# Patient Record
Sex: Female | Born: 1940 | Race: Black or African American | Hispanic: No | Smoking: Former smoker
Health system: Southern US, Community
[De-identification: ages and names within clinical notes are randomized; demographics above are authoritative.]

## PROBLEM LIST (undated history)

## (undated) DIAGNOSIS — C220 Liver cell carcinoma: Secondary | ICD-10-CM

## (undated) DIAGNOSIS — N189 Chronic kidney disease, unspecified: Secondary | ICD-10-CM

## (undated) DIAGNOSIS — I10 Essential (primary) hypertension: Secondary | ICD-10-CM

## (undated) DIAGNOSIS — F419 Anxiety disorder, unspecified: Secondary | ICD-10-CM

## (undated) DIAGNOSIS — Z950 Presence of cardiac pacemaker: Secondary | ICD-10-CM

## (undated) DIAGNOSIS — I509 Heart failure, unspecified: Secondary | ICD-10-CM

## (undated) DIAGNOSIS — F32A Depression, unspecified: Secondary | ICD-10-CM

## (undated) DIAGNOSIS — E119 Type 2 diabetes mellitus without complications: Secondary | ICD-10-CM

## (undated) DIAGNOSIS — F329 Major depressive disorder, single episode, unspecified: Secondary | ICD-10-CM

## (undated) HISTORY — PX: INSERT / REPLACE / REMOVE PACEMAKER: SUR710

## (undated) HISTORY — DX: Liver cell carcinoma: C22.0

## (undated) HISTORY — PX: OTHER SURGICAL HISTORY: SHX169

## (undated) HISTORY — DX: Chronic kidney disease, unspecified: N18.9

---

## 2016-02-24 ENCOUNTER — Ambulatory Visit: Payer: Self-pay | Admitting: Family Medicine

## 2016-03-01 NOTE — Progress Notes (Signed)
Subjective:     Patient ID: Anna Gray, female   DOB: [birth year], 75 y.o.   MRN: [id number]  HPI Anna Gray is a 75yo female presenting today to establish care.  - [id number], Hypertension, CHF (Does not know systolic vs. 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 Reports history of Radiofrequency Ablation and Chemoembolization. On 2 L nasal cannula at night due to metastasis.) - Medications:  Lantus 10 units at night (Reports previously on Glyburide. Does not have syringes or needles, so she has not been using since her discharge from [place] on Saturday 9/23. Would prefer transition to oral agent. Does not check blood sugars. Does not have a glucometer.)  Protonix 40 mg daily  Nifedipine 90 mg daily   Clonidine 0.2 mg twice a day  Isosorbide mononitrate 30 mg daily  Torsemide 20 mg daily  Metoprolol succinate 100 mg daily  Hydralazine 100 mg every 8 hours  Aspirin 81 mg daily  Allopurinol 100 mg daily (Unsure why she is on this medication. Denies history of gout. Would like to discontinue) - Allergies: Vicodin - Hospitalizations:  Reports hospitalizations for liver cancer and lung metastasis  Reports hospitalization in [place] from July to September 2017 for heart failure exacerbation. States she was home for 2 days during that time span however was readmitted. Pacemaker placed during this hospitalization. - Surgeries: Pacemaker placement 2017 - Specialists: Previously followed by cardiology, nephrology, pulmonology, and oncology. Requests referral to these specialists on arrival to [facility]. - Family History: Maternal history of hypertension. Denies history of diabetes, high cholesterol, cancer. - Social History: Previously lived in [place]. In July she moved to [place] with her son. In [place] she was hospitalized from July to September and was discharged to rehabilitation in [place]. She was discharged from  rehabilitation on Saturday, September 23 and moved to [place] with her daughter.  Review of Systems Per HPI.    Objective:   Physical Exam  Constitutional: She appears well-developed and well-nourished. No distress.  HENT:  Head: Normocephalic and atraumatic.  Cardiovascular: Normal rate and regular rhythm.   Murmur heard. Pulmonary/Chest: Effort normal. No respiratory distress. She has no wheezes.  Abdominal: Soft. She exhibits no distension. There is no tenderness.  Musculoskeletal: She exhibits no edema.  Skin: No rash noted.  Psychiatric: She has a normal mood and affect. Her behavior is normal.      Assessment and Plan:     Chronic systolic congestive heart failure [facility]) -Previously followed by cardiology in [place]. Reports hospitalization from July to September 2017 for heart failure exacerbation during which time a pacemaker was placed. Requests referral to get established with cardiology in [facility]. -Referral to cardiology placed -Aspirin, Imdur, metoprolol, nifedipine, and torsemide refilled -Following hospitalization she has been very weak. Requests home health for physical therapy and occupational therapy. Requests hospital bed, which it was recommended she get due to heart failure during her last hospitalization.  Malignant neoplasm of liver (HCC) -Reports history of liver cancer with metastasis to lung. Previously followed by oncology and pulmonology.  -Referral to oncology and pulmonology -O2 saturation of 93% with ambulation noted. Continue to liters nasal cannula at night. -Will check CMP, CBC, and cholesterol  Type 2 diabetes mellitus without complication, with long-term current use of insulin ([place]) -Reports difficulty giving injections. Was prescribed vials however did not have needles or syringes on discharge from rehabilitation. -Lantus pen prescribed. Will continue 10 units at night. -  Requests transition to oral medication. Is difficult to  adjust her insulin without labs. -Will check A1c -Follow-up over the next several weeks for a visit to focus on diabetes management  CKD (chronic kidney disease), stage III -Reported per patient -Will obtain CMP  Essential hypertension -Stable at 127/53 today -Clonidine, hydralazine, metoprolol, Procardia refilled -Continue to monitor  CKD (chronic kidney disease) -Will check CMP -Referral to nephrology placed

## 2016-03-02 ENCOUNTER — Ambulatory Visit (INDEPENDENT_AMBULATORY_CARE_PROVIDER_SITE_OTHER): Payer: Medicare Other | Admitting: Family Medicine

## 2016-03-02 VITALS — BP 127/53 | HR 69 | Temp 98.3°F | Ht 62.5 in | Wt 108.6 lb

## 2016-03-02 DIAGNOSIS — N183 Chronic kidney disease, stage 3 unspecified: Secondary | ICD-10-CM | POA: Insufficient documentation

## 2016-03-02 DIAGNOSIS — C229 Malignant neoplasm of liver, not specified as primary or secondary: Secondary | ICD-10-CM | POA: Diagnosis not present

## 2016-03-02 DIAGNOSIS — I10 Essential (primary) hypertension: Secondary | ICD-10-CM

## 2016-03-02 DIAGNOSIS — N189 Chronic kidney disease, unspecified: Secondary | ICD-10-CM | POA: Insufficient documentation

## 2016-03-02 DIAGNOSIS — Z794 Long term (current) use of insulin: Secondary | ICD-10-CM | POA: Diagnosis not present

## 2016-03-02 DIAGNOSIS — C7800 Secondary malignant neoplasm of unspecified lung: Secondary | ICD-10-CM

## 2016-03-02 DIAGNOSIS — E119 Type 2 diabetes mellitus without complications: Secondary | ICD-10-CM | POA: Diagnosis not present

## 2016-03-02 DIAGNOSIS — I5022 Chronic systolic (congestive) heart failure: Secondary | ICD-10-CM

## 2016-03-02 LAB — CBC WITH DIFFERENTIAL/PLATELET
BASOS PCT: 1 %
Basophils Absolute: 105 cells/uL (ref 0–200)
EOS ABS: 315 {cells}/uL (ref 15–500)
Eosinophils Relative: 3 %
HEMATOCRIT: 25.7 % — AB (ref 35.0–45.0)
HEMOGLOBIN: 8.6 g/dL — AB (ref 11.7–15.5)
LYMPHS ABS: 2520 {cells}/uL (ref 850–3900)
Lymphocytes Relative: 24 %
MCH: 25.7 pg — ABNORMAL LOW (ref 27.0–33.0)
MCHC: 33.5 g/dL (ref 32.0–36.0)
MCV: 76.9 fL — ABNORMAL LOW (ref 80.0–100.0)
MONO ABS: 630 {cells}/uL (ref 200–950)
MPV: 9.5 fL (ref 7.5–12.5)
Monocytes Relative: 6 %
NEUTROS ABS: 6930 {cells}/uL (ref 1500–7800)
Neutrophils Relative %: 66 %
Platelets: 386 10*3/uL (ref 140–400)
RBC: 3.34 MIL/uL — AB (ref 3.80–5.10)
RDW: 17.2 % — AB (ref 11.0–15.0)
WBC: 10.5 10*3/uL (ref 3.8–10.8)

## 2016-03-02 LAB — COMPLETE METABOLIC PANEL WITH GFR
ALK PHOS: 128 U/L (ref 33–130)
ALT: 14 U/L (ref 6–29)
AST: 31 U/L (ref 10–35)
Albumin: 3.4 g/dL — ABNORMAL LOW (ref 3.6–5.1)
BILIRUBIN TOTAL: 0.3 mg/dL (ref 0.2–1.2)
BUN: 46 mg/dL — ABNORMAL HIGH (ref 7–25)
CO2: 24 mmol/L (ref 20–31)
Calcium: 9 mg/dL (ref 8.6–10.4)
Chloride: 105 mmol/L (ref 98–110)
Creat: 2.66 mg/dL — ABNORMAL HIGH (ref 0.60–0.93)
GFR, EST AFRICAN AMERICAN: 20 mL/min — AB (ref >=60)
GFR, EST NON AFRICAN AMERICAN: 17 mL/min — AB (ref >=60)
Glucose, Bld: 198 mg/dL — ABNORMAL HIGH (ref 65–99)
POTASSIUM: 4 mmol/L (ref 3.5–5.3)
Sodium: 140 mmol/L (ref 135–146)
TOTAL PROTEIN: 7 g/dL (ref 6.1–8.1)

## 2016-03-02 LAB — LIPID PANEL
CHOL/HDL RATIO: 2.7 ratio (ref <=5.0)
Cholesterol: 150 mg/dL (ref 125–200)
HDL: 56 mg/dL (ref >=46)
LDL CALC: 65 mg/dL (ref <130)
TRIGLYCERIDES: 145 mg/dL (ref <150)
VLDL: 29 mg/dL (ref <30)

## 2016-03-02 LAB — POCT GLYCOSYLATED HEMOGLOBIN (HGB A1C): Hemoglobin A1C: 7

## 2016-03-02 MED ORDER — METOPROLOL SUCCINATE ER 100 MG PO TB24: 100.0000 mg | ORAL_TABLET | Freq: Every day | ORAL | 4 refills | Status: DC

## 2016-03-02 MED ORDER — NIFEDIPINE ER OSMOTIC RELEASE 90 MG PO TB24: 90.0000 mg | ORAL_TABLET | Freq: Every day | ORAL | 4 refills | Status: DC

## 2016-03-02 MED ORDER — HYDRALAZINE HCL 100 MG PO TABS: 100.0000 mg | ORAL_TABLET | Freq: Three times a day (TID) | ORAL | 4 refills | Status: DC

## 2016-03-02 MED ORDER — ASPIRIN EC 81 MG PO TBEC: 81.0000 mg | DELAYED_RELEASE_TABLET | Freq: Every day | ORAL | 4 refills | Status: AC

## 2016-03-02 MED ORDER — ISOSORBIDE MONONITRATE ER 30 MG PO TB24: 30.0000 mg | ORAL_TABLET | Freq: Every day | ORAL | 4 refills | Status: DC

## 2016-03-02 MED ORDER — TORSEMIDE 20 MG PO TABS: 20.0000 mg | ORAL_TABLET | Freq: Every day | ORAL | 4 refills | Status: DC

## 2016-03-02 MED ORDER — INSULIN GLARGINE 100 UNIT/ML SOLOSTAR PEN: 10.0000 [IU] | PEN_INJECTOR | Freq: Every day | SUBCUTANEOUS | 11 refills | Status: DC

## 2016-03-02 MED ORDER — CLONIDINE HCL 0.2 MG PO TABS: 0.2000 mg | ORAL_TABLET | Freq: Two times a day (BID) | ORAL | 4 refills | Status: DC

## 2016-03-02 MED ORDER — ONETOUCH ULTRA SYSTEM W/DEVICE KIT: 1.0000 | PACK | Freq: Once | 0 refills | Status: AC

## 2016-03-02 MED ORDER — PANTOPRAZOLE SODIUM 40 MG PO TBEC: 40.0000 mg | DELAYED_RELEASE_TABLET | Freq: Every day | ORAL | 4 refills | Status: DC

## 2016-03-02 NOTE — Patient Instructions (Signed)
Thank you so much for coming to visit today! You have quite the extensive history! Please sign the release of information forms so we can get your records from [place] and [place]! Your medications have been refilled. Please ask the pharmacist to review the insulin pen with you. I have placed referrals to Cardiology, Nephrology, Pulmonology, and Oncology. You should hear from them soon. We will check several labs today.  Please return in the next 2-4weeks so we can spend more time focusing on your diabetes.  Dr. Gerlean Ren

## 2016-03-02 NOTE — Assessment & Plan Note (Signed)
-  Will check CMP -Referral to nephrology placed

## 2016-03-02 NOTE — Assessment & Plan Note (Addendum)
-  Reports history of liver cancer with metastasis to lung. Previously followed by oncology and pulmonology.  -Referral to oncology and pulmonology -O2 saturation of 93% with ambulation noted. Continue to liters nasal cannula at night. -Will check CMP, CBC, and cholesterol

## 2016-03-02 NOTE — Assessment & Plan Note (Signed)
-  Reported per patient -Will obtain CMP

## 2016-03-02 NOTE — Assessment & Plan Note (Signed)
-  Stable at 127/53 today -Clonidine, hydralazine, metoprolol, Procardia refilled -Continue to monitor

## 2016-03-02 NOTE — Assessment & Plan Note (Signed)
-  Reports difficulty giving injections. Was prescribed vials however did not have needles or syringes on discharge from rehabilitation. -Lantus pen prescribed. Will continue 10 units at night. -Requests transition to oral medication. Is difficult to adjust her insulin without labs. -Will check A1c -Follow-up over the next several weeks for a visit to focus on diabetes management

## 2016-03-02 NOTE — Assessment & Plan Note (Addendum)
-  Previously followed by cardiology in [place]. Reports hospitalization from July to September 2017 for heart failure exacerbation during which time a pacemaker was placed. Requests referral to get established with cardiology in [facility]. -Referral to cardiology placed -Aspirin, Imdur, metoprolol, nifedipine, and torsemide refilled -Following hospitalization she has been very weak. Requests home health for physical therapy and occupational therapy. Requests hospital bed, which it was recommended she get due to heart failure during her last hospitalization.

## 2016-03-08 ENCOUNTER — Telehealth: Payer: Self-pay | Admitting: Family Medicine

## 2016-03-08 NOTE — Telephone Encounter (Signed)
Pt's daughter would like for you to give her a call, regarding referral to nephrology. Please advise. Thanks! ep

## 2016-03-09 ENCOUNTER — Emergency Department (HOSPITAL_COMMUNITY): Payer: Medicare Other

## 2016-03-09 ENCOUNTER — Other Ambulatory Visit: Payer: Self-pay

## 2016-03-09 ENCOUNTER — Institutional Professional Consult (permissible substitution): Payer: Medicare Other | Admitting: Internal Medicine

## 2016-03-09 ENCOUNTER — Encounter (HOSPITAL_COMMUNITY): Payer: Self-pay

## 2016-03-09 ENCOUNTER — Inpatient Hospital Stay (HOSPITAL_COMMUNITY)
Admission: EM | Admit: 2016-03-09 | Discharge: 2016-03-14 | DRG: 291 | Disposition: A | Discharge status: Home / Self Care | Payer: Medicare Other | Attending: Internal Medicine | Admitting: Internal Medicine

## 2016-03-09 DIAGNOSIS — Z8505 Personal history of malignant neoplasm of liver: Secondary | ICD-10-CM | POA: Diagnosis not present

## 2016-03-09 DIAGNOSIS — N179 Acute kidney failure, unspecified: Secondary | ICD-10-CM | POA: Diagnosis present

## 2016-03-09 DIAGNOSIS — Z950 Presence of cardiac pacemaker: Secondary | ICD-10-CM

## 2016-03-09 DIAGNOSIS — E872 Acidosis: Secondary | ICD-10-CM | POA: Diagnosis not present

## 2016-03-09 DIAGNOSIS — Z885 Allergy status to narcotic agent status: Secondary | ICD-10-CM | POA: Diagnosis not present

## 2016-03-09 DIAGNOSIS — N184 Chronic kidney disease, stage 4 (severe): Secondary | ICD-10-CM | POA: Diagnosis not present

## 2016-03-09 DIAGNOSIS — E1122 Type 2 diabetes mellitus with diabetic chronic kidney disease: Secondary | ICD-10-CM | POA: Diagnosis present

## 2016-03-09 DIAGNOSIS — Z794 Long term (current) use of insulin: Secondary | ICD-10-CM | POA: Diagnosis not present

## 2016-03-09 DIAGNOSIS — Y95 Nosocomial condition: Secondary | ICD-10-CM | POA: Diagnosis present

## 2016-03-09 DIAGNOSIS — Z66 Do not resuscitate: Secondary | ICD-10-CM | POA: Diagnosis present

## 2016-03-09 DIAGNOSIS — R0603 Acute respiratory distress: Secondary | ICD-10-CM

## 2016-03-09 DIAGNOSIS — R0600 Dyspnea, unspecified: Secondary | ICD-10-CM

## 2016-03-09 DIAGNOSIS — N189 Chronic kidney disease, unspecified: Secondary | ICD-10-CM | POA: Diagnosis present

## 2016-03-09 DIAGNOSIS — C787 Secondary malignant neoplasm of liver and intrahepatic bile duct: Secondary | ICD-10-CM | POA: Diagnosis not present

## 2016-03-09 DIAGNOSIS — Z87891 Personal history of nicotine dependence: Secondary | ICD-10-CM

## 2016-03-09 DIAGNOSIS — Z09 Encounter for follow-up examination after completed treatment for conditions other than malignant neoplasm: Secondary | ICD-10-CM

## 2016-03-09 DIAGNOSIS — I5023 Acute on chronic systolic (congestive) heart failure: Secondary | ICD-10-CM | POA: Diagnosis present

## 2016-03-09 DIAGNOSIS — I509 Heart failure, unspecified: Secondary | ICD-10-CM | POA: Diagnosis not present

## 2016-03-09 DIAGNOSIS — Z85118 Personal history of other malignant neoplasm of bronchus and lung: Secondary | ICD-10-CM

## 2016-03-09 DIAGNOSIS — J811 Chronic pulmonary edema: Secondary | ICD-10-CM

## 2016-03-09 DIAGNOSIS — Z9221 Personal history of antineoplastic chemotherapy: Secondary | ICD-10-CM

## 2016-03-09 DIAGNOSIS — I5043 Acute on chronic combined systolic (congestive) and diastolic (congestive) heart failure: Secondary | ICD-10-CM | POA: Diagnosis not present

## 2016-03-09 DIAGNOSIS — I248 Other forms of acute ischemic heart disease: Secondary | ICD-10-CM | POA: Diagnosis present

## 2016-03-09 DIAGNOSIS — I130 Hypertensive heart and chronic kidney disease with heart failure and stage 1 through stage 4 chronic kidney disease, or unspecified chronic kidney disease: Secondary | ICD-10-CM | POA: Diagnosis present

## 2016-03-09 DIAGNOSIS — Z9111 Patient's noncompliance with dietary regimen: Secondary | ICD-10-CM | POA: Diagnosis not present

## 2016-03-09 DIAGNOSIS — N183 Chronic kidney disease, stage 3 (moderate): Secondary | ICD-10-CM | POA: Diagnosis present

## 2016-03-09 DIAGNOSIS — I10 Essential (primary) hypertension: Secondary | ICD-10-CM | POA: Diagnosis present

## 2016-03-09 DIAGNOSIS — E876 Hypokalemia: Secondary | ICD-10-CM | POA: Diagnosis not present

## 2016-03-09 DIAGNOSIS — J9600 Acute respiratory failure, unspecified whether with hypoxia or hypercapnia: Secondary | ICD-10-CM | POA: Diagnosis not present

## 2016-03-09 DIAGNOSIS — I5021 Acute systolic (congestive) heart failure: Secondary | ICD-10-CM | POA: Diagnosis not present

## 2016-03-09 DIAGNOSIS — D509 Iron deficiency anemia, unspecified: Secondary | ICD-10-CM | POA: Diagnosis present

## 2016-03-09 DIAGNOSIS — E119 Type 2 diabetes mellitus without complications: Secondary | ICD-10-CM | POA: Diagnosis not present

## 2016-03-09 DIAGNOSIS — J9601 Acute respiratory failure with hypoxia: Secondary | ICD-10-CM | POA: Diagnosis present

## 2016-03-09 DIAGNOSIS — R748 Abnormal levels of other serum enzymes: Secondary | ICD-10-CM | POA: Diagnosis not present

## 2016-03-09 DIAGNOSIS — C229 Malignant neoplasm of liver, not specified as primary or secondary: Secondary | ICD-10-CM | POA: Diagnosis present

## 2016-03-09 DIAGNOSIS — Z7982 Long term (current) use of aspirin: Secondary | ICD-10-CM | POA: Diagnosis not present

## 2016-03-09 DIAGNOSIS — C227 Other specified carcinomas of liver: Secondary | ICD-10-CM | POA: Diagnosis not present

## 2016-03-09 DIAGNOSIS — J189 Pneumonia, unspecified organism: Secondary | ICD-10-CM | POA: Diagnosis present

## 2016-03-09 HISTORY — DX: Presence of cardiac pacemaker: Z95.0

## 2016-03-09 HISTORY — DX: Heart failure, unspecified: I50.9

## 2016-03-09 HISTORY — DX: Type 2 diabetes mellitus without complications: E11.9

## 2016-03-09 HISTORY — DX: Major depressive disorder, single episode, unspecified: F32.9

## 2016-03-09 HISTORY — DX: Anxiety disorder, unspecified: F41.9

## 2016-03-09 HISTORY — DX: Essential (primary) hypertension: I10

## 2016-03-09 HISTORY — DX: Depression, unspecified: F32.A

## 2016-03-09 LAB — COMPREHENSIVE METABOLIC PANEL
ALBUMIN: 3.3 g/dL — AB (ref 3.5–5.0)
ALT: 23 U/L (ref 14–54)
AST: 56 U/L — AB (ref 15–41)
Alkaline Phosphatase: 203 U/L — ABNORMAL HIGH (ref 38–126)
Anion gap: 13 (ref 5–15)
BUN: 44 mg/dL — AB (ref 6–20)
CHLORIDE: 105 mmol/L (ref 101–111)
CO2: 22 mmol/L (ref 22–32)
CREATININE: 2.37 mg/dL — AB (ref 0.44–1.00)
Calcium: 9.2 mg/dL (ref 8.9–10.3)
GFR calc Af Amer: 22 mL/min — ABNORMAL LOW (ref >60)
GFR, EST NON AFRICAN AMERICAN: 19 mL/min — AB (ref >60)
Glucose, Bld: 235 mg/dL — ABNORMAL HIGH (ref 65–99)
POTASSIUM: 3.5 mmol/L (ref 3.5–5.1)
SODIUM: 140 mmol/L (ref 135–145)
Total Bilirubin: 0.5 mg/dL (ref 0.3–1.2)
Total Protein: 8.8 g/dL — ABNORMAL HIGH (ref 6.5–8.1)

## 2016-03-09 LAB — CBC WITH DIFFERENTIAL/PLATELET
BASOS PCT: 0 %
Basophils Absolute: 0 10*3/uL (ref 0.0–0.1)
EOS PCT: 2 %
Eosinophils Absolute: 0.4 10*3/uL (ref 0.0–0.7)
HEMATOCRIT: 31.2 % — AB (ref 36.0–46.0)
Hemoglobin: 10.5 g/dL — ABNORMAL LOW (ref 12.0–15.0)
LYMPHS ABS: 8.1 10*3/uL — AB (ref 0.7–4.0)
Lymphocytes Relative: 44 %
MCH: 25.9 pg — AB (ref 26.0–34.0)
MCHC: 33.7 g/dL (ref 30.0–36.0)
MCV: 77 fL — AB (ref 78.0–100.0)
MONOS PCT: 4 %
Monocytes Absolute: 0.7 10*3/uL (ref 0.1–1.0)
Neutro Abs: 9.2 10*3/uL — ABNORMAL HIGH (ref 1.7–7.7)
Neutrophils Relative %: 50 %
Platelets: 474 10*3/uL — ABNORMAL HIGH (ref 150–400)
RBC: 4.05 MIL/uL (ref 3.87–5.11)
RDW: 16.6 % — AB (ref 11.5–15.5)
WBC: 18.4 10*3/uL — AB (ref 4.0–10.5)

## 2016-03-09 LAB — I-STAT TROPONIN, ED: TROPONIN I, POC: 0.01 ng/mL (ref 0.00–0.08)

## 2016-03-09 LAB — GLUCOSE, CAPILLARY
GLUCOSE-CAPILLARY: 332 mg/dL — AB (ref 65–99)
GLUCOSE-CAPILLARY: 249 mg/dL — AB (ref 65–99)
Glucose-Capillary: 171 mg/dL — ABNORMAL HIGH (ref 65–99)

## 2016-03-09 LAB — I-STAT CHEM 8, ED
BUN: 55 mg/dL — AB (ref 6–20)
CREATININE: 2.3 mg/dL — AB (ref 0.44–1.00)
Calcium, Ion: 1.16 mmol/L (ref 1.15–1.40)
Chloride: 106 mmol/L (ref 101–111)
GLUCOSE: 228 mg/dL — AB (ref 65–99)
HEMATOCRIT: 33 % — AB (ref 36.0–46.0)
Hemoglobin: 11.2 g/dL — ABNORMAL LOW (ref 12.0–15.0)
POTASSIUM: 3.8 mmol/L (ref 3.5–5.1)
Sodium: 142 mmol/L (ref 135–145)
TCO2: 25 mmol/L (ref 0–100)

## 2016-03-09 LAB — MRSA PCR SCREENING: MRSA BY PCR: NEGATIVE

## 2016-03-09 LAB — URINE MICROSCOPIC-ADD ON: RBC / HPF: NONE SEEN RBC/hpf (ref 0–5)

## 2016-03-09 LAB — TROPONIN I
TROPONIN I: 0.4 ng/mL — AB (ref <0.03)
TROPONIN I: 0.47 ng/mL — AB (ref <0.03)
Troponin I: 0.24 ng/mL (ref <0.03)

## 2016-03-09 LAB — URINALYSIS, ROUTINE W REFLEX MICROSCOPIC
Bilirubin Urine: NEGATIVE
Glucose, UA: 250 mg/dL — AB
Hgb urine dipstick: NEGATIVE
KETONES UR: NEGATIVE mg/dL
LEUKOCYTES UA: NEGATIVE
NITRITE: NEGATIVE
Specific Gravity, Urine: 1.016 (ref 1.005–1.030)
pH: 5 (ref 5.0–8.0)

## 2016-03-09 LAB — I-STAT CG4 LACTIC ACID, ED: Lactic Acid, Venous: 3.78 mmol/L (ref 0.5–1.9)

## 2016-03-09 LAB — BRAIN NATRIURETIC PEPTIDE: B Natriuretic Peptide: 1185.9 pg/mL — ABNORMAL HIGH (ref 0.0–100.0)

## 2016-03-09 MED ORDER — HYDRALAZINE HCL 20 MG/ML IJ SOLN
10.0000 mg | INTRAMUSCULAR | Status: DC | PRN
  Administered 2016-03-09 – 2016-03-10 (×2): 10 mg via INTRAVENOUS
  Filled 2016-03-09 (×2): qty 1

## 2016-03-09 MED ORDER — DEXTROSE 5 % IV SOLN
500.0000 mg | INTRAVENOUS | Status: DC
  Administered 2016-03-10 – 2016-03-12 (×3): 500 mg via INTRAVENOUS
  Filled 2016-03-09 (×4): qty 0.5

## 2016-03-09 MED ORDER — NITROGLYCERIN IN D5W 200-5 MCG/ML-% IV SOLN
5.0000 ug/min | INTRAVENOUS | Status: DC
  Administered 2016-03-09: 65 ug/min via INTRAVENOUS
  Administered 2016-03-09: 10 ug/min via INTRAVENOUS
  Administered 2016-03-10: 25 ug/min via INTRAVENOUS
  Administered 2016-03-10: 90 ug/min via INTRAVENOUS
  Administered 2016-03-10: 85 ug/min via INTRAVENOUS
  Filled 2016-03-09 (×3): qty 250

## 2016-03-09 MED ORDER — ONDANSETRON HCL 4 MG PO TABS
4.0000 mg | ORAL_TABLET | Freq: Four times a day (QID) | ORAL | Status: DC | PRN
  Administered 2016-03-10 – 2016-03-14 (×4): 4 mg via ORAL
  Filled 2016-03-09 (×4): qty 1

## 2016-03-09 MED ORDER — SODIUM CHLORIDE 0.9 % IV SOLN
INTRAVENOUS | Status: DC
  Administered 2016-03-09: 07:00:00 via INTRAVENOUS

## 2016-03-09 MED ORDER — ASPIRIN EC 81 MG PO TBEC
81.0000 mg | DELAYED_RELEASE_TABLET | Freq: Every day | ORAL | Status: DC
  Administered 2016-03-11 – 2016-03-14 (×4): 81 mg via ORAL
  Filled 2016-03-09 (×5): qty 1

## 2016-03-09 MED ORDER — LORAZEPAM 2 MG/ML IJ SOLN
0.5000 mg | Freq: Once | INTRAMUSCULAR | Status: AC
  Administered 2016-03-09: 0.5 mg via INTRAVENOUS
  Filled 2016-03-09: qty 1

## 2016-03-09 MED ORDER — VANCOMYCIN HCL IN DEXTROSE 1-5 GM/200ML-% IV SOLN
1000.0000 mg | Freq: Once | INTRAVENOUS | Status: AC
  Administered 2016-03-09: 1000 mg via INTRAVENOUS
  Filled 2016-03-09: qty 200

## 2016-03-09 MED ORDER — INSULIN GLARGINE 100 UNIT/ML ~~LOC~~ SOLN
10.0000 [IU] | Freq: Every day | SUBCUTANEOUS | Status: DC
  Administered 2016-03-09 – 2016-03-13 (×5): 10 [IU] via SUBCUTANEOUS
  Filled 2016-03-09 (×6): qty 0.1

## 2016-03-09 MED ORDER — ENOXAPARIN SODIUM 30 MG/0.3ML ~~LOC~~ SOLN
30.0000 mg | SUBCUTANEOUS | Status: DC
  Administered 2016-03-09 – 2016-03-14 (×6): 30 mg via SUBCUTANEOUS
  Filled 2016-03-09 (×6): qty 0.3

## 2016-03-09 MED ORDER — FUROSEMIDE 10 MG/ML IJ SOLN
40.0000 mg | Freq: Once | INTRAMUSCULAR | Status: AC
  Administered 2016-03-09: 40 mg via INTRAVENOUS
  Filled 2016-03-09: qty 4

## 2016-03-09 MED ORDER — ORAL CARE MOUTH RINSE
15.0000 mL | Freq: Two times a day (BID) | OROMUCOSAL | Status: DC
  Administered 2016-03-10 – 2016-03-13 (×6): 15 mL via OROMUCOSAL

## 2016-03-09 MED ORDER — METOPROLOL SUCCINATE ER 25 MG PO TB24
100.0000 mg | ORAL_TABLET | Freq: Every day | ORAL | Status: DC
  Administered 2016-03-10: 100 mg via ORAL
  Filled 2016-03-09: qty 4

## 2016-03-09 MED ORDER — LORAZEPAM 2 MG/ML IJ SOLN
1.0000 mg | Freq: Four times a day (QID) | INTRAMUSCULAR | Status: DC | PRN
  Administered 2016-03-09 – 2016-03-10 (×3): 1 mg via INTRAVENOUS
  Filled 2016-03-09 (×3): qty 1

## 2016-03-09 MED ORDER — CHLORHEXIDINE GLUCONATE 0.12 % MT SOLN
15.0000 mL | Freq: Two times a day (BID) | OROMUCOSAL | Status: DC
  Administered 2016-03-10 – 2016-03-14 (×5): 15 mL via OROMUCOSAL
  Filled 2016-03-09 (×6): qty 15

## 2016-03-09 MED ORDER — FUROSEMIDE 10 MG/ML IJ SOLN
40.0000 mg | Freq: Two times a day (BID) | INTRAMUSCULAR | Status: DC
  Administered 2016-03-09 – 2016-03-10 (×2): 40 mg via INTRAVENOUS
  Filled 2016-03-09 (×2): qty 4

## 2016-03-09 MED ORDER — INSULIN ASPART 100 UNIT/ML ~~LOC~~ SOLN
0.0000 [IU] | Freq: Three times a day (TID) | SUBCUTANEOUS | Status: DC
  Administered 2016-03-09: 3 [IU] via SUBCUTANEOUS
  Administered 2016-03-09: 7 [IU] via SUBCUTANEOUS
  Administered 2016-03-10 (×2): 2 [IU] via SUBCUTANEOUS
  Administered 2016-03-12: 1 [IU] via SUBCUTANEOUS
  Administered 2016-03-13: 2 [IU] via SUBCUTANEOUS
  Administered 2016-03-13 (×2): 1 [IU] via SUBCUTANEOUS
  Administered 2016-03-14: 2 [IU] via SUBCUTANEOUS

## 2016-03-09 MED ORDER — VANCOMYCIN HCL 500 MG IV SOLR
500.0000 mg | INTRAVENOUS | Status: DC
  Filled 2016-03-09: qty 500

## 2016-03-09 MED ORDER — LORAZEPAM 2 MG/ML IJ SOLN
1.0000 mg | Freq: Once | INTRAMUSCULAR | Status: AC
  Administered 2016-03-09: 1 mg via INTRAVENOUS
  Filled 2016-03-09: qty 1

## 2016-03-09 MED ORDER — NIFEDIPINE ER OSMOTIC RELEASE 90 MG PO TB24
90.0000 mg | ORAL_TABLET | Freq: Every day | ORAL | Status: DC
  Administered 2016-03-10 – 2016-03-12 (×3): 90 mg via ORAL
  Filled 2016-03-09 (×4): qty 1

## 2016-03-09 MED ORDER — PANTOPRAZOLE SODIUM 40 MG PO TBEC
40.0000 mg | DELAYED_RELEASE_TABLET | Freq: Every day | ORAL | Status: DC
  Administered 2016-03-10 – 2016-03-14 (×5): 40 mg via ORAL
  Filled 2016-03-09 (×5): qty 1

## 2016-03-09 MED ORDER — DEXTROSE 5 % IV SOLN
2.0000 g | Freq: Once | INTRAVENOUS | Status: AC
  Administered 2016-03-09: 2 g via INTRAVENOUS
  Filled 2016-03-09: qty 2

## 2016-03-09 MED ORDER — ONDANSETRON HCL 4 MG/2ML IJ SOLN
4.0000 mg | Freq: Four times a day (QID) | INTRAMUSCULAR | Status: DC | PRN
  Administered 2016-03-10 – 2016-03-13 (×3): 4 mg via INTRAVENOUS
  Filled 2016-03-09 (×3): qty 2

## 2016-03-09 MED ORDER — CLONIDINE HCL 0.1 MG PO TABS
0.2000 mg | ORAL_TABLET | Freq: Two times a day (BID) | ORAL | Status: DC
  Administered 2016-03-10: 0.1 mg via ORAL
  Administered 2016-03-10 – 2016-03-12 (×4): 0.2 mg via ORAL
  Filled 2016-03-09 (×5): qty 2

## 2016-03-09 MED ORDER — HYDRALAZINE HCL 50 MG PO TABS
100.0000 mg | ORAL_TABLET | Freq: Three times a day (TID) | ORAL | Status: DC
  Administered 2016-03-10 – 2016-03-12 (×7): 100 mg via ORAL
  Filled 2016-03-09 (×7): qty 2

## 2016-03-09 NOTE — Progress Notes (Signed)
Pt remains on BIPAP/NIV due to her WOB and SOB.

## 2016-03-09 NOTE — ED Notes (Signed)
Pt. Had begun to desat. On FIo2 of 80%. FIO2 has just been increased by R.T. Pt. Remains air-hungry and is in her position of comfort, which is to lie on her right side.

## 2016-03-09 NOTE — ED Notes (Signed)
MD at bedside. 

## 2016-03-09 NOTE — ED Notes (Signed)
Pt is on the bedpan

## 2016-03-09 NOTE — ED Notes (Signed)
The hospitalist is with pt. And her daughter. Pt. Remains very uncomfortable, having just received a second dose of IV ativan. Pt. Remains responsive and is consistently adamant about her desire not to be intubated as she continues to decline in terms of dyspnea.

## 2016-03-09 NOTE — Telephone Encounter (Signed)
LMOVM for daughter to call me back.

## 2016-03-09 NOTE — Progress Notes (Signed)
Pt refuses ABG. RN aware.

## 2016-03-09 NOTE — ED Notes (Signed)
Hospitalist at bedside 

## 2016-03-09 NOTE — ED Triage Notes (Signed)
Patient arrives Emergency traffic with complaints of respiratory distress since 0500. Patient has a hx of lung cancer, CHF, pace maker, liver cancer, chronic kidney disease.

## 2016-03-09 NOTE — Progress Notes (Signed)
Pharmacy Antibiotic Note  Anna Gray is a 75 y.o. female admitted on 03/09/2016 with pneumonia. CXR shows COPD. Interstitial edema or infiltrate compatible with CHF and/or pneumonia. Lactate elevated at 3.78.  Pharmacy has been consulted for vancomycin and cefepime dosing.  Plan: Vancomycin 1g IV ordered in ED, then 500mg   IV every 48 hours.  Goal trough 15-20 mcg/mL.  Cefepime 2g IV ordered in ED, then 500mg  IV q24h Follow up renal function & cultures, de-escalate as appropriate    No data recorded.   Recent Labs Lab 03/02/16 1041 03/09/16 0701 03/09/16 0708 03/09/16 0709  WBC 10.5 18.4*  --   --   CREATININE 2.66* 2.37* 2.30*  --   LATICACIDVEN  --   --   --  3.78*    Estimated Creatinine Clearance: 16.4 mL/min (by C-G formula based on SCr of 2.3 mg/dL (H)).    Allergies  Allergen Reactions  . Vicodin [Hydrocodone-Acetaminophen] Nausea And Vomiting    Antimicrobials this admission:  10/4 Vancomycin >> 10/4 Cefepime >>  Dose adjustments this admission:  ---  Microbiology results:  10/4 BCx: sent 10/4 UCx: sent   Thank you for allowing pharmacy to be a part of this patient's care.  Peggyann Juba, PharmD, BCPS Pager: [phone] 03/09/2016 7:59 AM

## 2016-03-09 NOTE — Progress Notes (Signed)
CRITICAL VALUE ALERT  Critical value received: troponin  Date of notification: 10/4  Time of notification: 1300  Critical value read back:yes  Nurse who received alert:  Dois Davenport, RN  MD notified (1st page):  Darrick Meigs, MD  Time of first page:  1305  Time MD responded:  no response yet, will continue to closely monitor pt.

## 2016-03-09 NOTE — ED Provider Notes (Signed)
[place] DEPT Provider Note   CSN: [id number] Arrival date & time: 03/09/16  0650     History   Chief Complaint Chief Complaint  Patient presents with  . Respiratory Distress    HPI Anna Gray is a 75 y.o. female.  75 year old female with past medical history including metastatic lung cancer, CHF, type 2 diabetes, hypertension who presents with respiratory distress. EMS picked the patient up from her home this morning for respiratory distress which she states began at 5 AM. She denies any fevers, vomiting, chest pain, or other source of pain. She does not want to be intubated.   The history is provided by the patient and the EMS personnel.    No past medical history on file.  Patient Active Problem List   Diagnosis Date Noted  . Chronic systolic congestive heart failure ([place]) 03/02/2016  . CKD (chronic kidney disease) 03/02/2016  . Malignant neoplasm metastatic to lung ([place]) 03/02/2016  . Malignant neoplasm of liver ([place]) 03/02/2016  . Type 2 diabetes mellitus without complication, with long-term current use of insulin ([place]) 03/02/2016  . Essential hypertension 03/02/2016    No past surgical history on file.  OB History    No data available       Home Medications    Prior to Admission medications   Medication Sig Start Date End Date Taking? Authorizing Provider  aspirin EC 81 MG tablet Take 1 tablet (81 mg total) by mouth daily. 03/02/16   [place] N Rumley, DO  cloNIDine (CATAPRES) 0.2 MG tablet Take 1 tablet (0.2 mg total) by mouth 2 (two) times daily. 03/02/16   [place] N Rumley, DO  hydrALAZINE (APRESOLINE) 100 MG tablet Take 1 tablet (100 mg total) by mouth 3 (three) times daily. 03/02/16   [place] N Rumley, DO  Insulin Glargine (LANTUS) 100 UNIT/ML Solostar Pen Inject 10 Units into the skin daily at 10 pm. 03/02/16   [place] N Rumley, DO  isosorbide mononitrate (IMDUR) 30 MG 24 hr tablet Take 1 tablet (30 mg total) by mouth daily. 03/02/16   [place] N  Rumley, DO  metoprolol succinate (TOPROL-XL) 100 MG 24 hr tablet Take 1 tablet (100 mg total) by mouth daily. Take with or immediately following a meal. 03/02/16   [place] N Rumley, DO  NIFEdipine (PROCARDIA XL/ADALAT-CC) 90 MG 24 hr tablet Take 1 tablet (90 mg total) by mouth daily. 03/02/16   [place] N Rumley, DO  pantoprazole (PROTONIX) 40 MG tablet Take 1 tablet (40 mg total) by mouth daily. 03/02/16   [place] N Rumley, DO  torsemide (DEMADEX) 20 MG tablet Take 1 tablet (20 mg total) by mouth daily. 03/02/16   Lorna Few, DO    Family History No family history on file.  Social History Social History  Substance Use Topics  . Smoking status: Not on file  . Smokeless tobacco: Not on file  . Alcohol use Not on file     Allergies   Vicodin [hydrocodone-acetaminophen]   Review of Systems Review of Systems  Unable to perform ROS: Severe respiratory distress     Physical Exam Updated Vital Signs BP (!) 199/109   Pulse 106   Resp (!) 38   SpO2 100%   Physical Exam  Constitutional: She is oriented to person, place, and time. She appears distressed.  Thin, frail, elderly woman in mild respiratory distress, on bipap  HENT:  Head: Normocephalic and atraumatic.  Moist mucous membranes  Eyes: Conjunctivae are normal. Pupils are equal, round, and reactive to  light.  Neck: Neck supple.  Cardiovascular: Regular rhythm and normal heart sounds.  Tachycardia present.   No murmur heard. Pulmonary/Chest: Accessory muscle usage present. Tachypnea noted. She is in respiratory distress.  Crackles b/l with diminished BS; on bipap  Abdominal: Soft. Bowel sounds are normal. She exhibits no distension. There is no tenderness.  Musculoskeletal: She exhibits no edema.  Neurological: She is alert and oriented to person, place, and time.  Fluent speech  Skin: Skin is warm and dry.  Nursing note and vitals reviewed.    ED Treatments / Results  Labs (all labs ordered are listed, but only  abnormal results are displayed) Labs Reviewed  COMPREHENSIVE METABOLIC PANEL - Abnormal; Notable for the following:       Result Value   Glucose, Bld 235 (*)    BUN 44 (*)    Creatinine, Ser 2.37 (*)    Total Protein 8.8 (*)    Albumin 3.3 (*)    AST 56 (*)    Alkaline Phosphatase 203 (*)    GFR calc non Af Amer 19 (*)    GFR calc Af Amer 22 (*)    All other components within normal limits  CBC WITH DIFFERENTIAL/PLATELET - Abnormal; Notable for the following:    WBC 18.4 (*)    Hemoglobin 10.5 (*)    HCT 31.2 (*)    MCV 77.0 (*)    MCH 25.9 (*)    RDW 16.6 (*)    Platelets 474 (*)    Neutro Abs 9.2 (*)    Lymphs Abs 8.1 (*)    All other components within normal limits  BRAIN NATRIURETIC PEPTIDE - Abnormal; Notable for the following:    B Natriuretic Peptide 1,185.9 (*)    All other components within normal limits  I-STAT CHEM 8, ED - Abnormal; Notable for the following:    BUN 55 (*)    Creatinine, Ser 2.30 (*)    Glucose, Bld 228 (*)    Hemoglobin 11.2 (*)    HCT 33.0 (*)    All other components within normal limits  I-STAT CG4 LACTIC ACID, ED - Abnormal; Notable for the following:    Lactic Acid, Venous 3.78 (*)    All other components within normal limits  CULTURE, BLOOD (ROUTINE X 2)  CULTURE, BLOOD (ROUTINE X 2)  URINE CULTURE  BLOOD GAS, ARTERIAL  URINALYSIS, ROUTINE W REFLEX MICROSCOPIC (NOT AT [facility])  I-STAT TROPOININ, ED    EKG  EKG Interpretation  Date/Time:  Wednesday March 09 2016 06:52:49 EDT Ventricular Rate:  123 PR Interval:    QRS Duration: 84 QT Interval:  322 QTC Calculation: 461 R Axis:   59 Text Interpretation:  Sinus or ectopic atrial tachycardia Consider left atrial enlargement Repol abnrm suggests ischemia, diffuse leads No old tracing to compare Confirmed by [facility]  MD, ELLIOTT [id number]) on 03/09/2016 7:14:22 AM       Radiology Dg Chest Port 1 View  Result Date: 03/09/2016 CLINICAL DATA:  Shortness of breath, difficulty breathing  with increased symptoms since 5 a.m. today. History of lung malignancy, CHF, diabetes, chronic renal insufficiency. EXAM: PORTABLE CHEST 1 VIEW COMPARISON:  None in PACs FINDINGS: The lungs are hyperinflated. The interstitial markings are increased diffusely with areas of near confluence in the left mid lung. The heart is top-normal in size. The pulmonary vascularity is mildly prominent centrally. There is calcification in the wall of the aortic arch. The permanent pacemaker is in reasonable position radiographically. IMPRESSION: COPD. Interstitial edema  or infiltrate compatible with CHF and/or pneumonia. Aortic atherosclerosis. Electronically Signed   By: David  Martinique M.D.   On: 03/09/2016 07:14    Procedures .Critical Care Performed by: Sharlett Iles Authorized by: Sharlett Iles   Critical care provider statement:    Critical care time (minutes):  60   Critical care time was exclusive of:  Separately billable procedures and treating other patients   Critical care was necessary to treat or prevent imminent or life-threatening deterioration of the following conditions:  Respiratory failure   Critical care was time spent personally by me on the following activities:  Development of treatment plan with patient or surrogate, evaluation of patient's response to treatment, examination of patient, obtaining history from patient or surrogate, review of old charts, re-evaluation of patient's condition, pulse oximetry, ordering and review of radiographic studies, ordering and review of laboratory studies and ordering and performing treatments and interventions   (including critical care time)  Medications Ordered in ED Medications  0.9 %  sodium chloride infusion ( Intravenous New Bag/Given 03/09/16 0716)  nitroGLYCERIN 50 mg in dextrose 5 % 250 mL (0.2 mg/mL) infusion (20 mcg/min Intravenous Rate/Dose Change 03/09/16 0726)  ceFEPIme (MAXIPIME) 2 g in dextrose 5 % 50 mL IVPB (2 g  Intravenous New Bag/Given 03/09/16 0759)  vancomycin (VANCOCIN) IVPB 1000 mg/200 mL premix (not administered)  vancomycin (VANCOCIN) 500 mg in sodium chloride 0.9 % 100 mL IVPB (not administered)  ceFEPIme (MAXIPIME) 500 mg in dextrose 5 % 50 mL IVPB (not administered)  LORazepam (ATIVAN) injection 0.5 mg (0.5 mg Intravenous Given 03/09/16 0812)     Initial Impression / Assessment and Plan / ED Course  I have reviewed the triage vital signs and the nursing notes.  Pertinent labs & imaging results that were available during my care of the patient were reviewed by me and considered in my medical decision making (see chart for details).  Clinical Course   Pt w/ known Lung cancer as well as CHF presents with respiratory distress beginning this morning. She was placed on BiPAP on arrival and my colleague began a nitroglycerin drip due to suspicion for CHF given that her initial BP was 199/109. She had crackles bilaterally, respiratory distress but mentating appropriately. Continued BiPAP and obtained above lab work. Initial lactate elevated at 3.78, creatinine 2.37 similar to previous. WBC elevated at 18.4. BNP also elevated at 1185. Chest x-ray shows pulmonary edema versus pneumonia. Treated for sepsis with blood and urine cultures, and broad-spectrum antibiotics to cover HCAP, as well as lasix to treat volume overload. Held on fluid resuscitation given that patient is in respiratory distress at least in part due to pulmonary edema from CHF exacerbation. Because she is severely hypertensive, I suspect CHF is the predominant factor driving her respiratory symptoms. She has required several doses of Ativan to tolerate the mask. She has also expressed that she does not want intubation. Her blood pressure has improved on nitroglycerin. I discussed admission with hospitalist, Dr. Darrick Meigs, and patient admitted to stepdown for further treatment.  Final Clinical Impressions(s) / ED Diagnoses   Final diagnoses:    None    New Prescriptions New Prescriptions   No medications on file     Sharlett Iles, MD 03/09/16 [phone]

## 2016-03-09 NOTE — H&P (Signed)
TRH H&P    Patient Demographics:    Anna Gray, is a 75 y.o. female  MRN: [id number]  DOB - [birth year]  Admit Date - 03/09/2016  Referring MD/NP/PA: Dr Rex Kras  Outpatient Primary MD for the patient is Junie Panning, DO  Patient coming from: Home  Chief Complaint  Patient presents with  . Respiratory Distress      HPI:    Anna Gray  is a 75 y.o. 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 Patient has recently moved to [place] from [place]. She was admitted to [facility] in [place] in July, was diagnosed with CHF underwent pacemaker placement, she was then transferred to rehabilitation for 3 weeks.   Patient currently on BiPAP and unable to provide significant history  As per the patient's daughter, she started having shortness of breath this morning. Denies chest pain, was coughing up clear phlegm, no nausea vomiting or diarrhea. Denies dysuria. No abdominal pain. No fever. Patient does have history of uncontrolled hypertension, and has not been compliant with low-salt diet.    Review of systems:      A full 10 point Review of Systems was done, except as stated above, all other Review of Systems were negative.   With Past History of the following :    Congestive heart failure Chronic kidney disease Hypertension Diabetes mellitus Malignant neoplasm of liver   Social History:      Social History  Substance Use Topics  . Smoking status: Not on file  . Smokeless tobacco: Not on file  . Alcohol use Not on file       Family History :   No family history of cancer   Home Medications:   Prior to Admission medications     Medication Sig Start Date End Date Taking? Authorizing Provider  albuterol (PROVENTIL) (2.5 MG/3ML) 0.083% nebulizer solution Take 2.5 mg by nebulization every 2 (two) hours as needed for wheezing or shortness of breath.   Yes Historical Provider, MD  cloNIDine (CATAPRES) 0.2 MG tablet Take 1 tablet (0.2 mg total) by mouth 2 (two) times daily. 03/02/16  Yes [place] N Rumley, DO  hydrALAZINE (APRESOLINE) 100 MG tablet Take 1 tablet (100 mg total) by mouth 3 (three) times daily. 03/02/16  Yes [place] N Rumley, DO  HYDROmorphone (DILAUDID) 2 MG tablet Take 2-4 mg by mouth every 12 (twelve) hours as needed for severe pain.   Yes Historical Provider, MD  isosorbide mononitrate (IMDUR) 30 MG 24 hr tablet Take 1 tablet (30 mg total) by mouth daily. 03/02/16  Yes [place] N Rumley, DO  LORazepam (ATIVAN) 0.5 MG tablet Take 0.5 mg by mouth every 8 (eight) hours as needed for anxiety.   Yes Historical Provider, MD  metoprolol succinate (TOPROL-XL) 100 MG 24 hr tablet Take 1 tablet (100 mg total) by mouth daily. Take with or immediately  following a meal. 03/02/16  Yes Snyder N Rumley, DO  Multiple Vitamin (MULTIVITAMIN WITH MINERALS) TABS tablet Take 1 tablet by mouth daily.   Yes Historical Provider, MD  NIFEdipine (PROCARDIA XL/ADALAT-CC) 90 MG 24 hr tablet Take 1 tablet (90 mg total) by mouth daily. 03/02/16  Yes [place] N Rumley, DO  ondansetron (ZOFRAN-ODT) 4 MG disintegrating tablet Take 4 mg by mouth every 8 (eight) hours as needed for nausea or vomiting.   Yes Historical Provider, MD  pantoprazole (PROTONIX) 40 MG tablet Take 1 tablet (40 mg total) by mouth daily. 03/02/16  Yes [place] N Rumley, DO  simethicone (MYLICON) 80 MG chewable tablet Chew 80 mg by mouth 4 (four) times daily as needed for flatulence.   Yes Historical Provider, MD  torsemide (DEMADEX) 20 MG tablet Take 1 tablet (20 mg total) by mouth daily. 03/02/16  Yes [place] N Rumley, DO  aspirin EC 81 MG tablet Take 1 tablet (81 mg total) by mouth  daily. Patient not taking: Reported on 03/09/2016 03/02/16   Burna Cash Rumley, DO  Insulin Glargine (LANTUS) 100 UNIT/ML Solostar Pen Inject 10 Units into the skin daily at 10 pm. 03/02/16   Lorna Few, DO     Allergies:     Allergies  Allergen Reactions  . Vicodin [Hydrocodone-Acetaminophen] Nausea And Vomiting     Physical Exam:   Vitals  Blood pressure (!) 177/102, pulse (!) 122, resp. rate (!) 28, SpO2 (!) 87 %.  1.  General:  African-American female, on BiPAP  2. Psychiatric: Somnolent but arousable, answers questions appropriately  3. Neurologic: No focal neurological deficits, all cranial nerves intact.Strength 5/5 all 4 extremities, sensation intact all 4 extremities, plantars down going.  4. Eyes : . PERRLA.  5. ENMT:  Not examined patient on BiPAP  6. Neck:  supple, no cervical lymphadenopathy appriciated,  7. Respiratory : On BiPAP, bilateral rhonchi  8. Cardiovascular : RRR, no gallops, rubs or murmurs, trace leg edema bilaterally  9. Gastrointestinal:  Positive bowel sounds, abdomen soft, non-tender to palpation,no hepatosplenomegaly, no rigidity or guarding       10. Skin:  No cyanosis, normal texture and turgor, no rash, lesions or ulcers      Data Review:    CBC  Recent Labs Lab 03/02/16 1041 03/09/16 0701 03/09/16 0708  WBC 10.5 18.4*  --   HGB 8.6* 10.5* 11.2*  HCT 25.7* 31.2* 33.0*  PLT 386 474*  --   MCV 76.9* 77.0*  --   MCH 25.7* 25.9*  --   MCHC 33.5 33.7  --   RDW 17.2* 16.6*  --   LYMPHSABS 2,520 8.1*  --   MONOABS 630 0.7  --   EOSABS 315 0.4  --   BASOSABS 105 0.0  --    ------------------------------------------------------------------------------------------------------------------  Chemistries   Recent Labs Lab 03/02/16 1041 03/09/16 0701 03/09/16 0708  NA 140 140 142  K 4.0 3.5 3.8  CL 105 105 106  CO2 24 22  --   GLUCOSE 198* 235* 228*  BUN 46* 44* 55*  CREATININE 2.66* 2.37* 2.30*  CALCIUM  9.0 9.2  --   AST 31 56*  --   ALT 14 23  --   ALKPHOS 128 203*  --   BILITOT 0.3 0.5  --    ------------------------------------------------------------------------------------------------------------------  ------------------------------------------------------------------------------------------------------------------ GFR: Estimated Creatinine Clearance: 16.4 mL/min (by C-G formula based on SCr of 2.3 mg/dL (H)). Liver Function Tests:  Recent Labs Lab 03/02/16 1041 03/09/16 0701  AST  31 56*  ALT 14 23  ALKPHOS 128 203*  BILITOT 0.3 0.5  PROT 7.0 8.8*  ALBUMIN 3.4* 3.3*   --------------------------------------------------------------------------------------------------------------- Urine analysis: No results found for: COLORURINE, APPEARANCEUR, LABSPEC, PHURINE, GLUCOSEU, HGBUR, BILIRUBINUR, KETONESUR, PROTEINUR, UROBILINOGEN, NITRITE, LEUKOCYTESUR    Imaging Results:    Dg Chest Port 1 View  Result Date: 03/09/2016 CLINICAL DATA:  Shortness of breath, difficulty breathing with increased symptoms since 5 a.m. today. History of lung malignancy, CHF, diabetes, chronic renal insufficiency. EXAM: PORTABLE CHEST 1 VIEW COMPARISON:  None in PACs FINDINGS: The lungs are hyperinflated. The interstitial markings are increased diffusely with areas of near confluence in the left mid lung. The heart is top-normal in size. The pulmonary vascularity is mildly prominent centrally. There is calcification in the wall of the aortic arch. The permanent pacemaker is in reasonable position radiographically. IMPRESSION: COPD. Interstitial edema or infiltrate compatible with CHF and/or pneumonia. Aortic atherosclerosis. Electronically Signed   By: David  Martinique M.D.   On: 03/09/2016 07:14    My personal review of EKG: Rhythm sinus tachycardia   Assessment & Plan:    Active Problems:   Acute on chronic congestive heart failure (HCC)   Acute respiratory failure (HCC)  Chronic kidney disease  stage III   Diabetes mellitus  Uncontrolled hypertension  Healthcare associated pneumonia  1. Acute hypoxic respiratory failure- likely a combination of healthcare associated pneumonia as well as congestive heart failure. Patient given Lasix 40 mg IV in the ED, but continue with Lasix 40 mg IV every 12 hours, vancomycin and cefepime per pharmacy consultation. Follow blood culture results. Chest x-ray showed CHF versus pneumonia. Continue BiPAP. 2. Acute congestive heart failure-BNP significantly elevated 1185, patient recently diagnosed with congestive heart failure underwent extensive workup in [place]. [facility]. Continue Lasix 40 mg IV every 12 hours. Continue nitroglycerin infusion. Abdomen negative in the ED, follow serial troponin 3. ?Healthcare associated pneumonia- patient came with WBCs 18,000, lactate 3.78. Started on vancomycin and cefepime per pharmacy consultation. Follow blood culture results. Chest x-ray shows infiltrate versus pulmonary edema, will repeat chest x-ray PA and lateral in a.m. consider stopping antibiotics if chest x-ray shows no pneumonia in a.m. 4. Diabetes mellitus- start sliding scale insulin with NovoLog. Continue Lantus 10 units subcutaneous daily 5. Uncontrolled hypertension- patient started on nitroglycerin infusion, will restart patient's home medications and try to wean off nitroglycerin. 6. Metastatic liver cancer- patient has metastatic hepatocellular carcinoma, status post ablation. Not currently undergoing treatments at this time 7. Chronic kidney disease stage III-unknown baseline, continue to monitor kidney functions in the hospital.   DVT Prophylaxis-   Lovenox   AM Labs Ordered, also please review Full Orders  Family Communication: Admission, patients condition and plan of care including tests being ordered have been discussed with the patient's daughter at bedside  who indicate  understanding and agree with the plan and Code Status.  Code Status: DO NOT RESUSCITATE  Admission status: Inpatient  Time spent in minutes : 60 minutes  Bernardo Brayman S M.D on 03/09/2016 at 9:22 AM  Between 7am to 7pm - Pager - [phone]. After 7pm go to [email or web address] - password [facility]  [facility] - Office  [phone]

## 2016-03-09 NOTE — ED Notes (Signed)
Pt. Remains awake and oriented x 4 and refuses all further needle sticks, including Blood cultures and ABG.

## 2016-03-10 ENCOUNTER — Inpatient Hospital Stay (HOSPITAL_COMMUNITY): Payer: Medicare Other

## 2016-03-10 DIAGNOSIS — I10 Essential (primary) hypertension: Secondary | ICD-10-CM

## 2016-03-10 DIAGNOSIS — I5023 Acute on chronic systolic (congestive) heart failure: Secondary | ICD-10-CM

## 2016-03-10 DIAGNOSIS — N184 Chronic kidney disease, stage 4 (severe): Secondary | ICD-10-CM

## 2016-03-10 DIAGNOSIS — C227 Other specified carcinomas of liver: Secondary | ICD-10-CM

## 2016-03-10 LAB — COMPREHENSIVE METABOLIC PANEL
ALK PHOS: 157 U/L — AB (ref 38–126)
ALT: 25 U/L (ref 14–54)
AST: 50 U/L — AB (ref 15–41)
Albumin: 2.7 g/dL — ABNORMAL LOW (ref 3.5–5.0)
Anion gap: 9 (ref 5–15)
BILIRUBIN TOTAL: 0.5 mg/dL (ref 0.3–1.2)
BUN: 52 mg/dL — AB (ref 6–20)
CALCIUM: 8.7 mg/dL — AB (ref 8.9–10.3)
CO2: 22 mmol/L (ref 22–32)
Chloride: 110 mmol/L (ref 101–111)
Creatinine, Ser: 2.78 mg/dL — ABNORMAL HIGH (ref 0.44–1.00)
GFR calc Af Amer: 18 mL/min — ABNORMAL LOW (ref >60)
GFR, EST NON AFRICAN AMERICAN: 16 mL/min — AB (ref >60)
Glucose, Bld: 158 mg/dL — ABNORMAL HIGH (ref 65–99)
POTASSIUM: 3.1 mmol/L — AB (ref 3.5–5.1)
Sodium: 141 mmol/L (ref 135–145)
TOTAL PROTEIN: 7 g/dL (ref 6.5–8.1)

## 2016-03-10 LAB — CBC
HCT: 27.1 % — ABNORMAL LOW (ref 36.0–46.0)
Hemoglobin: 9.4 g/dL — ABNORMAL LOW (ref 12.0–15.0)
MCH: 26.4 pg (ref 26.0–34.0)
MCHC: 34.7 g/dL (ref 30.0–36.0)
MCV: 76.1 fL — AB (ref 78.0–100.0)
Platelets: 393 10*3/uL (ref 150–400)
RBC: 3.56 MIL/uL — ABNORMAL LOW (ref 3.87–5.11)
RDW: 16.6 % — AB (ref 11.5–15.5)
WBC: 15.3 10*3/uL — AB (ref 4.0–10.5)

## 2016-03-10 LAB — URINE CULTURE: Culture: NO GROWTH

## 2016-03-10 LAB — ECHOCARDIOGRAM COMPLETE: WEIGHTICAEL: 1791.9 [oz_av]

## 2016-03-10 LAB — GLUCOSE, CAPILLARY
GLUCOSE-CAPILLARY: 161 mg/dL — AB (ref 65–99)
GLUCOSE-CAPILLARY: 111 mg/dL — AB (ref 65–99)
Glucose-Capillary: 187 mg/dL — ABNORMAL HIGH (ref 65–99)
Glucose-Capillary: 104 mg/dL — ABNORMAL HIGH (ref 65–99)

## 2016-03-10 LAB — HEMOGLOBIN A1C
Hgb A1c MFr Bld: 6.8 % — ABNORMAL HIGH (ref 4.8–5.6)
Mean Plasma Glucose: 148 mg/dL

## 2016-03-10 LAB — INFLUENZA PANEL BY PCR (TYPE A & B)
H1N1 flu by pcr: NOT DETECTED
INFLBPCR: NEGATIVE
Influenza A By PCR: NEGATIVE

## 2016-03-10 MED ORDER — PERFLUTREN LIPID MICROSPHERE
1.0000 mL | INTRAVENOUS | Status: AC | PRN
  Administered 2016-03-10: 1 mL via INTRAVENOUS
  Filled 2016-03-10: qty 10

## 2016-03-10 MED ORDER — ACETAMINOPHEN 650 MG RE SUPP
650.0000 mg | Freq: Four times a day (QID) | RECTAL | Status: DC | PRN
  Administered 2016-03-10: 650 mg via RECTAL
  Filled 2016-03-10: qty 1

## 2016-03-10 MED ORDER — PROCHLORPERAZINE EDISYLATE 5 MG/ML IJ SOLN
5.0000 mg | Freq: Once | INTRAMUSCULAR | Status: AC
  Administered 2016-03-10: 5 mg via INTRAVENOUS
  Filled 2016-03-10: qty 2

## 2016-03-10 MED ORDER — POTASSIUM CHLORIDE 10 MEQ/100ML IV SOLN
10.0000 meq | INTRAVENOUS | Status: AC
  Administered 2016-03-10 (×4): 10 meq via INTRAVENOUS
  Filled 2016-03-10 (×4): qty 100

## 2016-03-10 MED ORDER — FUROSEMIDE 10 MG/ML IJ SOLN
80.0000 mg | Freq: Three times a day (TID) | INTRAMUSCULAR | Status: DC
  Administered 2016-03-10 – 2016-03-11 (×3): 80 mg via INTRAVENOUS
  Filled 2016-03-10 (×3): qty 8

## 2016-03-10 MED ORDER — METOPROLOL SUCCINATE ER 25 MG PO TB24
200.0000 mg | ORAL_TABLET | Freq: Every day | ORAL | Status: DC
  Administered 2016-03-11: 200 mg via ORAL
  Filled 2016-03-10: qty 8

## 2016-03-10 NOTE — Telephone Encounter (Signed)
Spoke to daughter, pt is now impatient at [facility]. Records requested from [place]. I plan to send nephrology referral to [facility] for scheduling.

## 2016-03-10 NOTE — Progress Notes (Signed)
Pt seen, awake, alert, no increased wob or respiratory distress noted, or voiced by pt at this time.  HR 96, rr21, Spo2 96% on 2lnc.  Bipap not indicated at this time but remains in room on standby.  RT will continue to monitor and assess as needed.

## 2016-03-10 NOTE — Progress Notes (Signed)
PCR for flu negative. Droplet precautions discontinued.

## 2016-03-10 NOTE — Progress Notes (Signed)
[facility] PROGRESS NOTE  Anna Gray [id number] DOB: 01-May-1941 DOA: 03/09/2016  PCP: Junie Panning, DO  Brief History/Interval Summary: 75 year old African-American female with a past medical history of for diabetes, hypertension, 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 She was some found to have acute congestive heart failure, but also was febrile. She was hospitalized for further management.  Reason for Visit: Acute CHF  Consultants: None yet  Procedures: Transthoracic echocardiogram is pending  Antibiotics: Vancomycin and cefepime  Subjective/Interval History: Patient is on the BiPAP. Patient does not respond  to questions at this time. Her daughter is at the bedside.  ROS: Patient unable to answer any questions at this time.  Objective:  Vital Signs  Vitals:   03/10/16 0400 03/10/16 0800 03/10/16 0900 03/10/16 1011  BP: (!) 156/79 (!) 170/82 (!) 145/67   Pulse: 98 95    Resp: (!) 21 (!) 21 19   Temp: 99.9 F (37.7 C) 99.9 F (37.7 C) 99.9 F (37.7 C)   TempSrc:      SpO2: 99% 100% 100% 98%  Weight:        Intake/Output Summary (Last 24 hours) at 03/10/16 1028 Last data filed at 03/10/16 0900  Gross per 24 hour  Intake           493.48 ml  Output              545 ml  Net           -51.52 ml   Filed Weights   03/09/16 1600  Weight: 50.8 kg (111 lb 15.9 oz)    General appearance: Patient on the BiPAP. Not fully responsive. Resp: Crackles noted bilateral bases. No wheezing. No rhonchi. Cardio: regular rate and rhythm, S1, S2 normal, no murmur, click, rub or gallop GI: soft, non-tender; bowel sounds normal; no masses,  no organomegaly Extremities: No significant edema noted bilateral lower  extremities.   Lab Results:  Data Reviewed: I have personally reviewed following labs and imaging studies  CBC:  Recent Labs Lab 03/09/16 0701 03/09/16 0708 03/10/16 0336  WBC 18.4*  --  15.3*  NEUTROABS 9.2*  --   --   HGB 10.5* 11.2* 9.4*  HCT 31.2* 33.0* 27.1*  MCV 77.0*  --  76.1*  PLT 474*  --  [id number]    Basic Metabolic Panel:  Recent Labs Lab 03/09/16 0701 03/09/16 0708 03/10/16 0336  NA 140 142 141  K 3.5 3.8 3.1*  CL 105 106 110  CO2 22  --  22  GLUCOSE 235* 228* 158*  BUN 44* 55* 52*  CREATININE 2.37* 2.30* 2.78*  CALCIUM 9.2  --  8.7*    GFR: Estimated Creatinine Clearance: 14 mL/min (by C-G formula based on SCr of 2.78 mg/dL (H)).  Liver Function Tests:  Recent Labs Lab 03/09/16 0701 03/10/16 0336  AST 56* 50*  ALT 23 25  ALKPHOS 203* 157*  BILITOT 0.5 0.5  PROT 8.8* 7.0  ALBUMIN 3.3* 2.7*    Cardiac Enzymes:  Recent Labs Lab 03/09/16 1126 03/09/16 1721 03/09/16 2256  TROPONINI 0.24* 0.40* 0.47*     HbA1C:  Recent Labs  03/09/16 1126  HGBA1C 6.8*    CBG:  Recent Labs Lab 03/09/16 1221 03/09/16 1612 03/09/16 2142 03/10/16 0751  GLUCAP 332* 249* 171* 187*  Recent Results (from the past 240 hour(s))  MRSA PCR Screening     Status: None   Collection Time: 03/09/16 11:43 AM  Result Value Ref Range Status   MRSA by PCR NEGATIVE NEGATIVE Final    Comment:        The GeneXpert MRSA Assay (FDA approved for NASAL specimens only), is one component of a comprehensive MRSA colonization surveillance program. It is not intended to diagnose MRSA infection nor to guide or monitor treatment for MRSA infections.       Radiology Studies: Dg Chest Port 1 View  Result Date: 03/10/2016 CLINICAL DATA:  Shortness of breath, dyspnea, difficulty breathing, remote history of lung cancer EXAM: PORTABLE CHEST 1 VIEW COMPARISON:  03/09/2016 FINDINGS: Diffuse asymmetric interstitial opacities throughout the lungs compatible with  persistent edema. Small pleural effusions noted. There is associated bibasilar atelectasis. Stable mild cardiac enlargement. No pneumothorax. Left subclavian pacemaker noted. Trachea is midline. Atherosclerosis of the aorta. IMPRESSION: Persistent interstitial edema pattern and pleural effusions compatible with CHF. Electronically Signed   By: Jerilynn Mages.  Shick M.D.   On: 03/10/2016 08:44   Dg Chest Port 1 View  Result Date: 03/09/2016 CLINICAL DATA:  Shortness of breath, difficulty breathing with increased symptoms since 5 a.m. today. History of lung malignancy, CHF, diabetes, chronic renal insufficiency. EXAM: PORTABLE CHEST 1 VIEW COMPARISON:  None in PACs FINDINGS: The lungs are hyperinflated. The interstitial markings are increased diffusely with areas of near confluence in the left mid lung. The heart is top-normal in size. The pulmonary vascularity is mildly prominent centrally. There is calcification in the wall of the aortic arch. The permanent pacemaker is in reasonable position radiographically. IMPRESSION: COPD. Interstitial edema or infiltrate compatible with CHF and/or pneumonia. Aortic atherosclerosis. Electronically Signed   By: David  Martinique M.D.   On: 03/09/2016 07:14     Medications:  Scheduled: . aspirin EC  81 mg Oral Daily  . ceFEPime (MAXIPIME) IV  500 mg Intravenous Q24H  . chlorhexidine  15 mL Mouth Rinse BID  . cloNIDine  0.2 mg Oral BID  . enoxaparin (LOVENOX) injection  30 mg Subcutaneous Q24H  . furosemide  80 mg Intravenous Q8H  . hydrALAZINE  100 mg Oral TID  . insulin aspart  0-9 Units Subcutaneous TID WC  . insulin glargine  10 Units Subcutaneous Q2200  . mouth rinse  15 mL Mouth Rinse q12n4p  . metoprolol succinate  100 mg Oral Daily  . NIFEdipine  90 mg Oral Daily  . pantoprazole  40 mg Oral Daily  . potassium chloride  10 mEq Intravenous Q1 Hr x 4  . [START ON 03/11/2016] vancomycin  500 mg Intravenous Q48H   Continuous: . sodium chloride 10 mL/hr at 03/09/16  0716  . nitroGLYCERIN 85 mcg/min (03/10/16 0926)   [id number], hydrALAZINE, LORazepam, ondansetron **OR** ondansetron (ZOFRAN) IV  Assessment/Plan:  Active Problems:   CKD (chronic kidney disease)   Malignant neoplasm of liver (HCC)   Type 2 diabetes mellitus without complication, with long-term current use of insulin (HCC)   Essential hypertension   Acute on chronic congestive heart failure ([place])   Acute respiratory failure (HCC)    Acute hypoxic respiratory failure Likely multifactorial including congestive heart failure and possible pneumonia. Patient also had fever overnight, so influenza is a possibility as well. Patient is currently on BiPAP. She is DO NOT RESUSCITATE. Continue diuretics, as well as broad-spectrum antibiotics.   Acute congestive heart failure. Type of CHF is unknown. No older data is available.  Patient moved here recently from [place]. We have requested records from the outside facility. In the meantime, we will also get an echocardiogram here. She is noted to be on intravenous Lasix. However, due to her chronic kidney disease, it is likely she will need a higher dose and so the dose of Lasix will be increased. Strict ins and outs. Daily weights. Continue nitroglycerin infusion.  Mildly elevated troponin. Likely due to congestive heart failure and chronic kidney disease. Follow up on echocardiogram. Hold off on full anticoagulation for now.  Questionable healthcare associated pneumonia. Patient was febrile overnight. Cultures are all pending. Continue vancomycin and cefepime. Influenza PCR also ordered. Repeat chest x-ray showed persistent bilateral infiltrate suggesting pulmonary edema.  Chronic kidney disease likely stage III with hypokalemia No older data is available. Creatinine is close to what it was a few weeks ago when she had it checked as part of her outpatient visit. Monitor closely while she is being aggressively diuresed. According to the  daughters patient had been seen by nephrology in [place] and dialysis was discussed, which patient declined at that time. Replace potassium.  Metastatic liver cancer. Patient has a history of metastatic hepatocellular carcinoma. She is status post ablation. Currently not receiving any treatment.  Diabetes mellitus type 2. Continue with insulin, sliding scale coverage. Monitor CBGs.  Accelerated hypertension. Patient is on nitroglycerin infusion. Blood pressure is reasonably well controlled. Patient's other home medications have been resumed. However, it might be challenging to have her take these medications in her current state.  Microcytic anemia. Baseline hemoglobin is not known. No overt bleeding noted. Continue to monitor hemoglobin. Check anemia panel.  DVT Prophylaxis: Lovenox    Code Status: DO NOT RESUSCITATE  Family Communication: Discussed with the patient's daughter  Disposition Plan: Continue management as outlined above. Will remain in ICU/stepdown setting for now    LOS: 1 day   [facility] Pager [phone] 03/10/2016, 10:28 AM  If 7PM-7AM, please contact night-coverage at [email or web address], password [facility]

## 2016-03-10 NOTE — Progress Notes (Signed)
PT is sleeping at this time- does not appear to be in respiratory distress at this time. Remains of BiPAP- on 5 LPM [place]- Sp02 95%, HR 95. Will change BiPAP to PRN- RN aware.

## 2016-03-10 NOTE — Progress Notes (Signed)
*  PRELIMINARY RESULTS* Echocardiogram 2D Echocardiogram with definity has been performed.  Leavy Cella 03/10/2016, 12:01 PM

## 2016-03-10 NOTE — Consult Note (Addendum)
Admit date: 03/09/2016 Referring Physician  Dr. [place] Pink Primary Physician Junie Panning, DO Primary Cardiologist  New (Saw in [place] Previously) Reason for Consultation  Heart failure  HPI: 75 year old female with prior diagnosis of cardiomyopathy,, hospitalizations for liver cancer and lung metastasis, hospitalizations in [place] from July to September 2017 for heart failure exacerbation, she was at home for 2 days during that time his pain and was readmitted, pacemaker placement in 2017(not defibrillator)brought to the hospital for worsening shortness of breath. She was also febrile.  Transthoracic echocardiogram demonstrated ejection fraction of 30-35%.she has a DO NOT RESUSCITATE order.  Troponin was also mildly elevated.  Her creatinine was fairly elevated at 2.8. This is close to what it was a few weeks ago. Patient declined dialysis in [place].  Has been quite somnolent on BiPAP now on [place]. Daughter in room.   PMH:   Past Medical History:  Diagnosis Date  . Anxiety   . CHF (congestive heart failure) ([place])   . Depression   . Diabetes mellitus without complication ([place])   . Hypertension   . Presence of permanent cardiac pacemaker     PSH:   Past Surgical History:  Procedure Laterality Date  . INSERT / REPLACE / REMOVE PACEMAKER     Allergies:  Vicodin [hydrocodone-acetaminophen] Prior to Admit Meds:   Prior to Admission medications   Medication Sig Start Date End Date Taking? Authorizing Provider  albuterol (PROVENTIL) (2.5 MG/3ML) 0.083% nebulizer solution Take 2.5 mg by nebulization every 2 (two) hours as needed for wheezing or shortness of breath.   Yes Historical Provider, MD  cloNIDine (CATAPRES) 0.2 MG tablet Take 1 tablet (0.2 mg total) by mouth 2 (two) times daily. 03/02/16  Yes [place] N Rumley, DO  hydrALAZINE (APRESOLINE) 100 MG tablet Take 1 tablet (100 mg total) by mouth 3 (three) times daily. 03/02/16  Yes [place] N Rumley, DO  HYDROmorphone (DILAUDID) 2  MG tablet Take 2-4 mg by mouth every 12 (twelve) hours as needed for severe pain.   Yes Historical Provider, MD  isosorbide mononitrate (IMDUR) 30 MG 24 hr tablet Take 1 tablet (30 mg total) by mouth daily. 03/02/16  Yes [place] N Rumley, DO  LORazepam (ATIVAN) 0.5 MG tablet Take 0.5 mg by mouth every 8 (eight) hours as needed for anxiety.   Yes Historical Provider, MD  metoprolol succinate (TOPROL-XL) 100 MG 24 hr tablet Take 1 tablet (100 mg total) by mouth daily. Take with or immediately following a meal. 03/02/16  Yes [place] N Rumley, DO  Multiple Vitamin (MULTIVITAMIN WITH MINERALS) TABS tablet Take 1 tablet by mouth daily.   Yes Historical Provider, MD  NIFEdipine (PROCARDIA XL/ADALAT-CC) 90 MG 24 hr tablet Take 1 tablet (90 mg total) by mouth daily. 03/02/16  Yes [place] N Rumley, DO  ondansetron (ZOFRAN-ODT) 4 MG disintegrating tablet Take 4 mg by mouth every 8 (eight) hours as needed for nausea or vomiting.   Yes Historical Provider, MD  pantoprazole (PROTONIX) 40 MG tablet Take 1 tablet (40 mg total) by mouth daily. 03/02/16  Yes [place] N Rumley, DO  simethicone (MYLICON) 80 MG chewable tablet Chew 80 mg by mouth 4 (four) times daily as needed for flatulence.   Yes Historical Provider, MD  torsemide (DEMADEX) 20 MG tablet Take 1 tablet (20 mg total) by mouth daily. 03/02/16  Yes [place] N Rumley, DO  aspirin EC 81 MG tablet Take 1 tablet (81 mg total) by mouth daily. Patient not taking: Reported on 03/09/2016 03/02/16  [place], DO  Insulin Glargine (LANTUS) 100 UNIT/ML Solostar Pen Inject 10 Units into the skin daily at 10 pm. 03/02/16   Lorna Few, DO    Scheduled Meds: . aspirin EC  81 mg Oral Daily  . ceFEPime (MAXIPIME) IV  500 mg Intravenous Q24H  . chlorhexidine  15 mL Mouth Rinse BID  . cloNIDine  0.2 mg Oral BID  . enoxaparin (LOVENOX) injection  30 mg Subcutaneous Q24H  . furosemide  80 mg Intravenous Q8H  . hydrALAZINE  100 mg Oral TID  . insulin aspart  0-9 Units  Subcutaneous TID WC  . insulin glargine  10 Units Subcutaneous Q2200  . mouth rinse  15 mL Mouth Rinse q12n4p  . metoprolol succinate  100 mg Oral Daily  . NIFEdipine  90 mg Oral Daily  . pantoprazole  40 mg Oral Daily  . [START ON 03/11/2016] vancomycin  500 mg Intravenous Q48H   Continuous Infusions: . sodium chloride 10 mL/hr at 03/09/16 0716  . nitroGLYCERIN 85 mcg/min (03/10/16 0926)   PRN Meds:.acetaminophen, hydrALAZINE, LORazepam, ondansetron **OR** ondansetron (ZOFRAN) IV  Fam HX:   No early family history of coronary artery disease Social HX:    Social History   Social History  . Marital status: Divorced    Spouse name: N/A  . Number of children: N/A  . Years of education: N/A   Occupational History  . Not on file.   Social History Main Topics  . Smoking status: Former Smoker    Types: Cigarettes  . Smokeless tobacco: Never Used     Comment: hasn't smoked in past 4 months  . Alcohol use No  . Drug use: No  . Sexual activity: No   Other Topics Concern  . Not on file   Social History Narrative  . No narrative on file     ROS:  All 11 ROS were addressed and are negative except what is stated in the HPI, challenging to obtain history from patient.   Physical Exam: Blood pressure (!) 164/86, pulse 95, temperature 100 F (37.8 C), resp. rate (!) 27, weight 111 lb 15.9 oz (50.8 kg), SpO2 95 %.   General: ill-appearing in no acute distress Head:   Normal cephalic and atramatic  Lungs:   Mild crackles heard B. Heart:   HRRR S1 S2 Pulses are 2+ & equal. No murmur, rubs, gallops.  No carotid bruit. No JVD.  No abdominal bruits.  Abdomen: Bowel sounds are positive, abdomen soft and non-tender without masses. No hepatosplenomegaly. Msk:  Back normal. Normal strength and tone for age. Extremities:  No clubbing, cyanosis or edema.  DP +1 Neuro: somnolent GU: Deferred Rectal: Deferred Psych:  Difficult to fully assess     Labs: Lab Results  Component Value  Date   WBC 15.3 (H) 03/10/2016   HGB 9.4 (L) 03/10/2016   HCT 27.1 (L) 03/10/2016   MCV 76.1 (L) 03/10/2016   PLT 393 03/10/2016     Recent Labs Lab 03/10/16 0336  NA 141  K 3.1*  CL 110  CO2 22  BUN 52*  CREATININE 2.78*  CALCIUM 8.7*  PROT 7.0  BILITOT 0.5  ALKPHOS 157*  ALT 25  AST 50*  GLUCOSE 158*    Recent Labs  03/09/16 1126 03/09/16 1721 03/09/16 2256  TROPONINI 0.24* 0.40* 0.47*   Lab Results  Component Value Date   CHOL 150 03/02/2016   HDL 56 03/02/2016   LDLCALC 65 03/02/2016   TRIG 145  03/02/2016   No results found for: DDIMER   BNP 1185  Creatinine increased from 2.37-2.78     Radiology:  Dg Chest Port 1 View  Result Date: 03/10/2016 CLINICAL DATA:  Shortness of breath, dyspnea, difficulty breathing, remote history of lung cancer EXAM: PORTABLE CHEST 1 VIEW COMPARISON:  03/09/2016 FINDINGS: Diffuse asymmetric interstitial opacities throughout the lungs compatible with persistent edema. Small pleural effusions noted. There is associated bibasilar atelectasis. Stable mild cardiac enlargement. No pneumothorax. Left subclavian pacemaker noted. Trachea is midline. Atherosclerosis of the aorta. IMPRESSION: Persistent interstitial edema pattern and pleural effusions compatible with CHF. Electronically Signed   By: Jerilynn Mages.  Shick M.D.   On: 03/10/2016 08:44   Dg Chest Port 1 View  Result Date: 03/09/2016 CLINICAL DATA:  Shortness of breath, difficulty breathing with increased symptoms since 5 a.m. today. History of lung malignancy, CHF, diabetes, chronic renal insufficiency. EXAM: PORTABLE CHEST 1 VIEW COMPARISON:  None in PACs FINDINGS: The lungs are hyperinflated. The interstitial markings are increased diffusely with areas of near confluence in the left mid lung. The heart is top-normal in size. The pulmonary vascularity is mildly prominent centrally. There is calcification in the wall of the aortic arch. The permanent pacemaker is in reasonable position  radiographically. IMPRESSION: COPD. Interstitial edema or infiltrate compatible with CHF and/or pneumonia. Aortic atherosclerosis. Electronically Signed   By: David  [place] M.D.   On: 03/09/2016 07:14   Personally viewed.  EKG:  03/09/16-heart rate 123 bpm, possible ectopic atrial tachycardia.Personally viewed.   ECHO: 03/10/16 - Left ventricle: The cavity size was normal. Wall thickness was   increased in a pattern of moderate LVH. Systolic function was   moderately to severely reduced. The estimated ejection fraction   was in the range of 30% to 35%. Akinesis of the anterior and   apical myocardium. - Aortic valve: There was mild regurgitation. - Mitral valve: There was mild to moderate regurgitation. - Left atrium: The atrium was mildly dilated. - Right atrium: The atrium was mildly dilated. - Tricuspid valve: There was mild-moderate regurgitation. - Pulmonary arteries: Systolic pressure was moderately increased.   PA peak pressure: 66 mm Hg (S).  ASSESSMENT/PLAN:    75 year old  With metastatic liver/lung cancer, pacemaker placement, cardiomyopathy, ejection fraction 35%, mildly elevated troponin here with weakness, shortness of breath.  Acute on chronic systolic heart failure  - Ejection fraction has been reduced in the past I can gather from previous hospitalizations for heart failure.  - she also must of had evidence of sick sinus syndrome or significant bradycardia given her pacemaker placement.  - She was placed on Lasix 40 mg IV every 12 hourswhich has subsequently been increased to 80 mg every 8 hours.  - she is not produced any negative output in fact she is 1.2 L positive..  - Blood pressures been quite elevated. She has been placed on a nitroglycerin drip. Continue to wean off. Discussed with nursing  - I will increase her metoprolol to 200 mg a day from 100 mg a day, she does have a pacemaker for backup. This will start tomorrow after further diuresis.  - she does not  particularly appear volume overloaded, In fact, her creatinine has increased since admission, however giving additional Lasix today and reassessing tomorrow should take place.  - primary team is treating as well for possible pneumonia viral pneumonia.  - Continuing with hydralazine for afterload reduction.  Metastatic cancer  - Per primary team  Hypoxic respiratory failure  - Perhaps combination of  heart failure as well as underlying lung illness.  Pacemaker  - Stable  Concerned about her overall long term prognosis.   Candee Furbish, MD  03/10/2016  1:57 PM

## 2016-03-10 NOTE — Care Management Note (Signed)
Case Management Note  Patient Details  Name: [name] MRN: [id number] Date of Birth: 1940/11/16  Subjective/Objective:    cehst pain with elevated tropoins/iv ntg drip/poss urosepsis           Action/Plan: from home   Expected Discharge Date:   (unknown)               Expected Discharge Plan:  Home/Self Care  In-House Referral:     Discharge planning Services     Post Acute Care Choice:    Choice offered to:     DME Arranged:    DME Agency:     HH Arranged:    [place] Agency:     Status of Service:  In process, will continue to follow  If discussed at Long Length of Stay Meetings, dates discussed:    Additional Comments:Date:  March 10, 2016 Chart reviewed for concurrent status and case management needs. Will continue to follow the patient for status change: Discharge Planning: following for needs Expected discharge date: [id number] Velva Harman, BSN, [place], [place] Leeroy Cha, RN 03/10/2016, 10:38 AM

## 2016-03-11 DIAGNOSIS — I5043 Acute on chronic combined systolic (congestive) and diastolic (congestive) heart failure: Secondary | ICD-10-CM

## 2016-03-11 LAB — GLUCOSE, CAPILLARY
GLUCOSE-CAPILLARY: 94 mg/dL (ref 65–99)
GLUCOSE-CAPILLARY: 99 mg/dL (ref 65–99)
Glucose-Capillary: 86 mg/dL (ref 65–99)
Glucose-Capillary: 165 mg/dL — ABNORMAL HIGH (ref 65–99)

## 2016-03-11 LAB — BASIC METABOLIC PANEL
ANION GAP: 8 (ref 5–15)
BUN: 54 mg/dL — ABNORMAL HIGH (ref 6–20)
CHLORIDE: 112 mmol/L — AB (ref 101–111)
CO2: 20 mmol/L — AB (ref 22–32)
Calcium: 8.8 mg/dL — ABNORMAL LOW (ref 8.9–10.3)
Creatinine, Ser: 2.95 mg/dL — ABNORMAL HIGH (ref 0.44–1.00)
GFR calc non Af Amer: 15 mL/min — ABNORMAL LOW (ref >60)
GFR, EST AFRICAN AMERICAN: 17 mL/min — AB (ref >60)
Glucose, Bld: 78 mg/dL (ref 65–99)
POTASSIUM: 3.7 mmol/L (ref 3.5–5.1)
Sodium: 140 mmol/L (ref 135–145)

## 2016-03-11 LAB — CBC
HEMATOCRIT: 25.2 % — AB (ref 36.0–46.0)
HEMOGLOBIN: 8.8 g/dL — AB (ref 12.0–15.0)
MCH: 26.5 pg (ref 26.0–34.0)
MCHC: 34.9 g/dL (ref 30.0–36.0)
MCV: 75.9 fL — ABNORMAL LOW (ref 78.0–100.0)
Platelets: 348 10*3/uL (ref 150–400)
RBC: 3.32 MIL/uL — AB (ref 3.87–5.11)
RDW: 16.7 % — ABNORMAL HIGH (ref 11.5–15.5)
WBC: 13.1 10*3/uL — AB (ref 4.0–10.5)

## 2016-03-11 LAB — RETICULOCYTES
RBC.: 3.32 MIL/uL — ABNORMAL LOW (ref 3.87–5.11)
Retic Count, Absolute: 59.8 10*3/uL (ref 19.0–186.0)
Retic Ct Pct: 1.8 % (ref 0.4–3.1)

## 2016-03-11 LAB — FERRITIN: Ferritin: 110 ng/mL (ref 11–307)

## 2016-03-11 LAB — IRON AND TIBC
IRON: 73 ug/dL (ref 28–170)
SATURATION RATIOS: 31 % (ref 10.4–31.8)
TIBC: 237 ug/dL — AB (ref 250–450)
UIBC: 164 ug/dL

## 2016-03-11 LAB — VITAMIN B12: Vitamin B-12: 379 pg/mL (ref 180–914)

## 2016-03-11 LAB — FOLATE: FOLATE: 22.5 ng/mL (ref >5.9)

## 2016-03-11 MED ORDER — ISOSORBIDE MONONITRATE ER 30 MG PO TB24
30.0000 mg | ORAL_TABLET | Freq: Every day | ORAL | Status: DC
  Administered 2016-03-11 – 2016-03-14 (×4): 30 mg via ORAL
  Filled 2016-03-11 (×4): qty 1

## 2016-03-11 MED ORDER — ACETAMINOPHEN 325 MG PO TABS: 650.0000 mg | ORAL_TABLET | Freq: Four times a day (QID) | ORAL | Status: DC | PRN

## 2016-03-11 MED ORDER — LORAZEPAM 1 MG PO TABS
1.0000 mg | ORAL_TABLET | Freq: Four times a day (QID) | ORAL | Status: DC | PRN
  Administered 2016-03-11: 1 mg via ORAL
  Filled 2016-03-11: qty 1

## 2016-03-11 MED ORDER — OXYCODONE HCL 5 MG PO TABS
5.0000 mg | ORAL_TABLET | ORAL | Status: DC | PRN
  Administered 2016-03-11 – 2016-03-14 (×6): 5 mg via ORAL
  Filled 2016-03-11 (×6): qty 1

## 2016-03-11 MED ORDER — TORSEMIDE 10 MG PO TABS
40.0000 mg | ORAL_TABLET | Freq: Every day | ORAL | Status: DC
  Administered 2016-03-11 – 2016-03-12 (×2): 40 mg via ORAL
  Filled 2016-03-11 (×2): qty 2

## 2016-03-11 NOTE — Progress Notes (Signed)
Pharmacy Antibiotic Note  Anna Gray is a 75 y.o. female admitted on 03/09/2016 with pneumonia. CXR shows COPD. Interstitial edema or infiltrate compatible with CHF and/or pneumonia. Lactate elevated at 3.78.  Pharmacy has been consulted for vancomycin and cefepime dosing.  Today, 03/11/2016 Day #3 antibiotics  Renal: SCr worsening, UOP adequate  WBC improving  Fevers improving - ? Fevers related to HF  MRSA PCR neg and cultures unrevealing  Plan:  Worsening renal function - vancomycin 500mg  IV q48h dosed appropriately for renal function. Consider stopping with unrevealing cultures and negative MRSA PCR.    Cefepime 500mg  IV q24h remains dosed appropriately, suggest change to oral (such as: cefpodixime 200mg  dail or amox/clav 500mg m daily or cefuroxime 500mg  daily) once afebrile for > 24h.  Versus stop antibiotics if HF more likely than PNA as cause of symptoms.   Follow up renal function & cultures, de-escalate as appropriate Height: 5\' 2"  (157.5 cm) Weight: 111 lb 15.9 oz (50.8 kg) IBW/kg (Calculated) : 50.1  Temp (24hrs), Avg:100 F (37.8 C), Min:99.1 F (37.3 C), Max:100.8 F (38.2 C)   Recent Labs Lab 03/09/16 0701 03/09/16 0708 03/09/16 0709 03/10/16 0336 03/11/16 0318  WBC 18.4*  --   --  15.3* 13.1*  CREATININE 2.37* 2.30*  --  2.78* 2.95*  LATICACIDVEN  --   --  3.78*  --   --     Estimated Creatinine Clearance: 13 mL/min (by C-G formula based on SCr of 2.95 mg/dL (H)).    Allergies  Allergen Reactions  . Vicodin [Hydrocodone-Acetaminophen] Nausea And Vomiting    Antimicrobials this admission:  10/4 Vancomycin >> 10/4 Cefepime >>  Dose adjustments this admission:  ---  Microbiology results:  10/4 BCx: NGTD 10/4 UCx: NG 10/4 MRSA PCR: neg 10/5 flu PCR: neg   Thank you for allowing pharmacy to be a part of this patient's care.  Doreene Eland, PharmD, BCPS.   Pager: [phone] 03/11/2016 7:53 AM

## 2016-03-11 NOTE — Progress Notes (Signed)
Patient Name: Anna Gray Date of Encounter: 03/11/2016  Primary Cardiologist: Ether Griffins (Saw Cards in [place])  Hospital Problem List     Active Problems:   CKD (chronic kidney disease)   Malignant neoplasm of liver (HCC)   Type 2 diabetes mellitus without complication, with long-term current use of insulin (HCC)   Essential hypertension   Acute on chronic congestive heart failure (HCC)   Acute respiratory failure (HCC)     Subjective   Feels much better, less SOB, no CP  Inpatient Medications    Scheduled Meds: . aspirin EC  81 mg Oral Daily  . ceFEPime (MAXIPIME) IV  500 mg Intravenous Q24H  . chlorhexidine  15 mL Mouth Rinse BID  . cloNIDine  0.2 mg Oral BID  . enoxaparin (LOVENOX) injection  30 mg Subcutaneous Q24H  . furosemide  80 mg Intravenous Q8H  . hydrALAZINE  100 mg Oral TID  . insulin aspart  0-9 Units Subcutaneous TID WC  . insulin glargine  10 Units Subcutaneous Q2200  . mouth rinse  15 mL Mouth Rinse q12n4p  . metoprolol succinate  200 mg Oral Daily  . NIFEdipine  90 mg Oral Daily  . pantoprazole  40 mg Oral Daily   Continuous Infusions: . sodium chloride Stopped (03/10/16 1600)  . nitroGLYCERIN Stopped (03/10/16 1525)   PRN Meds:.acetaminophen, hydrALAZINE, LORazepam, ondansetron **OR** ondansetron (ZOFRAN) IV, oxyCODONE   Vital Signs    Vitals:   03/11/16 0900 03/11/16 1000 03/11/16 1012 03/11/16 1014  BP:   (!) 154/66   Pulse:    82  Resp: (!) 21 (!) 21    Temp: 99.7 F (37.6 C) 99.7 F (37.6 C)    TempSrc:      SpO2: 97% 97%    Weight:      Height:        Intake/Output Summary (Last 24 hours) at 03/11/16 1045 Last data filed at 03/11/16 1000  Gross per 24 hour  Intake           472.73 ml  Output             1965 ml  Net         -1492.27 ml   Filed Weights   03/09/16 1600 03/10/16 1600  Weight: 111 lb 15.9 oz (50.8 kg) 111 lb 15.9 oz (50.8 kg)    Physical Exam    GEN: Elderly thin, in no acute distress.  HEENT:  Grossly normal.  Neck: Supple, no JVD, carotid bruits, or masses. Cardiac: RRR, no murmurs, rubs, or gallops. No clubbing, cyanosis, edema.  Radials/DP/PT 2+ and equal bilaterally.  Respiratory:  Respirations regular and unlabored, clear to auscultation bilaterally. GI: Soft, nontender, nondistended, BS + x 4. MS: no deformity or atrophy. Skin: warm and dry, no rash. Neuro:  Strength and sensation are intact. Psych: AAOx3.  Normal affect.  Labs    CBC  Recent Labs  03/09/16 0701  03/10/16 0336 03/11/16 0318  WBC 18.4*  --  15.3* 13.1*  NEUTROABS 9.2*  --   --   --   HGB 10.5*  < > 9.4* 8.8*  HCT 31.2*  < > 27.1* 25.2*  MCV 77.0*  --  76.1* 75.9*  PLT 474*  --  393 348  < > = values in this interval not displayed. Basic Metabolic Panel  Recent Labs  03/10/16 0336 03/11/16 0318  NA 141 140  K 3.1* 3.7  CL 110 112*  CO2 22 20*  GLUCOSE 158*  78  BUN 52* 54*  CREATININE 2.78* 2.95*  CALCIUM 8.7* 8.8*   Liver Function Tests  Recent Labs  03/09/16 0701 03/10/16 0336  AST 56* 50*  ALT 23 25  ALKPHOS 203* 157*  BILITOT 0.5 0.5  PROT 8.8* 7.0  ALBUMIN 3.3* 2.7*   No results for input(s): LIPASE, AMYLASE in the last 72 hours. Cardiac Enzymes  Recent Labs  03/09/16 1126 03/09/16 1721 03/09/16 2256  TROPONINI 0.24* 0.40* 0.47*   BNP Invalid input(s): POCBNP D-Dimer No results for input(s): DDIMER in the last 72 hours. Hemoglobin A1C  Recent Labs  03/09/16 1126  HGBA1C 6.8*   Fasting Lipid Panel No results for input(s): CHOL, HDL, LDLCALC, TRIG, CHOLHDL, LDLDIRECT in the last 72 hours. Thyroid Function Tests No results for input(s): TSH, T4TOTAL, T3FREE, THYROIDAB in the last 72 hours.  Invalid input(s): FREET3  Telemetry    No adverse rhythms - Personally Reviewed  ECG    03/09/16-heart rate 123 bpm, possible ectopic atrial tachycardia - Personally Reviewed  Radiology    Dg Chest Port 1 View  Result Date: 03/10/2016 CLINICAL DATA:   Shortness of breath, dyspnea, difficulty breathing, remote history of lung cancer EXAM: PORTABLE CHEST 1 VIEW COMPARISON:  03/09/2016 FINDINGS: Diffuse asymmetric interstitial opacities throughout the lungs compatible with persistent edema. Small pleural effusions noted. There is associated bibasilar atelectasis. Stable mild cardiac enlargement. No pneumothorax. Left subclavian pacemaker noted. Trachea is midline. Atherosclerosis of the aorta. IMPRESSION: Persistent interstitial edema pattern and pleural effusions compatible with CHF. Electronically Signed   By: Jerilynn Mages.  Shick M.D.   On: 03/10/2016 08:44     Cardiac Studies   ECHO: 03/10/16 - Left ventricle: The cavity size was normal. Wall thickness was increased in a pattern of moderate LVH. Systolic function was moderately to severely reduced. The estimated ejection fraction was in the range of 30% to 35%. Akinesis of the anterior and apical myocardium. - Aortic valve: There was mild regurgitation. - Mitral valve: There was mild to moderate regurgitation. - Left atrium: The atrium was mildly dilated. - Right atrium: The atrium was mildly dilated. - Tricuspid valve: There was mild-moderate regurgitation. - Pulmonary arteries: Systolic pressure was moderately increased. PA peak pressure: 66 mm Hg (S).   Patient Profile     75 year old  With metastatic liver/lung cancer, pacemaker placement, cardiomyopathy, ejection fraction 35%, mildly elevated troponin here with weakness, shortness of breath.  Assessment & Plan     Acute on chronic systolic heart failure  - Ejection fraction has been reduced in the past I can gather from previous hospitalizations for heart failure.  - she also must of had evidence of sick sinus syndrome or significant bradycardia given her pacemaker placement.  - She was placed on Lasix 40 mg IV every 12 hours which has subsequently been increased to 80 mg every 8 hours.  - she is negative 49ml. Changing to PO  now. Torsemide 20 QD as outpatient, will make 40 QD Creat 2.95   - Blood pressures have been quite elevated. She had been placed on a nitroglycerin drip. Now off.   -metoprolol XL to 200 mg a day, she does have a pacemaker for backup.   - she does not particularly appear volume overloaded  - primary team is treating as well for possible pneumonia viral pneumonia.  - Continuing with hydralazine for afterload reduction.  Metastatic cancer  - Per primary team  Hypoxic respiratory failure  - Perhaps combination of heart failure as well as underlying lung  illness.  Pacemaker  - Stable  Concerned about her overall long term prognosis. Much better today however.    Signed, Candee Furbish, MD  03/11/2016, 10:45 AM

## 2016-03-11 NOTE — Progress Notes (Signed)
Pharmacist Heart Failure Core Measure Documentation  Assessment: Anna Gray has an EF documented as 30-35% on 03/10/16 by ECHO.  Rationale: Heart failure patients with left ventricular systolic dysfunction (LVSD) and an EF < 40% should be prescribed an angiotensin converting enzyme inhibitor (ACEI) or angiotensin receptor blocker (ARB) at discharge unless a contraindication is documented in the medical record.  This patient is not currently on an ACEI or ARB for HF.  This note is being placed in the record in order to provide documentation that a contraindication to the use of these agents is present for this encounter.  ACE Inhibitor or Angiotensin Receptor Blocker is contraindicated (specify all that apply)  []   ACEI allergy AND ARB allergy []   Angioedema []   Moderate or severe aortic stenosis []   Hyperkalemia []   Hypotension []   Renal artery stenosis [x]   Worsening renal function, preexisting renal disease or dysfunction   [name] 03/11/2016 8:01 AM

## 2016-03-11 NOTE — Progress Notes (Signed)
Pt expressed wishes to be intubated and placed on ventilator if she stops breathing and wanted to change code status from DNR to full code. RN and charge nurse verified wishes to be intubated and CPR with pt and daughter and granddaughter, who were at bedside. Pt is alert and oriented x4 and in no distress at this time. Daughter confirms that pt has always wanted to be a full code but was given a sedative upon admission which caused confusion. Wishes were relayed to [facility] NP and code status has been changed to full code.   Bobbye Riggs, RN

## 2016-03-11 NOTE — Progress Notes (Signed)
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 She was some found to have acute congestive heart failure, but also was febrile. She was hospitalized for further management.  Reason for Visit: Acute CHF  Consultants: None yet  Procedures:  Transthoracic echocardiogram Study Conclusions - Left ventricle: The cavity size was normal. Wall thickness was   increased in a pattern of moderate LVH. Systolic function was   moderately to severely reduced. The estimated ejection fraction   was in the range of 30% to 35%. Akinesis of the anterior and   apical myocardium. - Aortic valve: There was mild regurgitation. - Mitral valve: There was mild to moderate regurgitation. - Left atrium: The atrium was mildly dilated. - Right atrium: The atrium was mildly dilated. - Tricuspid valve: There was mild-moderate regurgitation. - Pulmonary arteries: Systolic pressure was moderately increased.   PA peak pressure: 66 mm Hg (S).  Antibiotics: Vancomycin and cefepime  Subjective/Interval History: Patient is much more awake and alert this morning. She was taken off of her BiPAP yesterday and did not require it overnight. She tells me that she is feeling much better. Her breathing is improved. She denies any chest pain.   ROS: Denies any nausea or vomiting  Objective:  Vital Signs  Vitals:   03/10/16 2122 03/11/16 0000 03/11/16 0359 03/11/16 0400  BP:  (!) 118/54  (!) 155/69  Pulse:      Resp:  15  (!) 23  Temp:  100 F  (37.8 C) 99.5 F (37.5 C) 99.1 F (37.3 C)  TempSrc:  Core (Comment) Core (Comment)   SpO2: 96% 98%  97%  Weight:      Height:        Intake/Output Summary (Last 24 hours) at 03/11/16 0751 Last data filed at 03/11/16 0600  Gross per 24 hour  Intake           694.13 ml  Output             1640 ml  Net          -945.87 ml   Filed Weights   03/09/16 1600 03/10/16 1600  Weight: 50.8 kg (111 lb 15.9 oz) 50.8 kg (111 lb 15.9 oz)    General appearance: Awake, alert. No distress.Marland Kitchen Resp: Crackles noted bilateral bases. No wheezing. No rhonchi. Cardio: regular rate and rhythm, S1, S2 normal, no murmur, click, rub or gallop GI: soft, non-tender; bowel sounds normal; no masses,  no organomegaly Extremities: No significant edema noted bilateral lower extremities. Neurological: Oriented 3. Cranial nerves II-12 intact. Motor strength equal bilateral upper and lower extremities.  Lab Results:  Data Reviewed: I have personally reviewed following labs and imaging studies  CBC:  Recent Labs Lab 03/09/16 0701 03/09/16 0708 03/10/16 0336 03/11/16 0318  WBC 18.4*  --  15.3* 13.1*  NEUTROABS 9.2*  --   --   --   HGB 10.5* 11.2* 9.4* 8.8*  HCT 31.2* 33.0* 27.1* 25.2*  MCV 77.0*  --  76.1* 75.9*  PLT 474*  --  [phone]    Basic Metabolic Panel:  Recent Labs Lab 03/09/16 0701  03/09/16 0708 03/10/16 0336 03/11/16 0318  NA 140 142 141 140  K 3.5 3.8 3.1* 3.7  CL 105 106 110 112*  CO2 22  --  22 20*  GLUCOSE 235* 228* 158* 78  BUN 44* 55* 52* 54*  CREATININE 2.37* 2.30* 2.78* 2.95*  CALCIUM 9.2  --  8.7* 8.8*    GFR: Estimated Creatinine Clearance: 13 mL/min (by C-G formula based on SCr of 2.95 mg/dL (H)).  Liver Function Tests:  Recent Labs Lab 03/09/16 0701 03/10/16 0336  AST 56* 50*  ALT 23 25  ALKPHOS 203* 157*  BILITOT 0.5 0.5  PROT 8.8* 7.0  ALBUMIN 3.3* 2.7*    Cardiac Enzymes:  Recent Labs Lab 03/09/16 1126 03/09/16 1721 03/09/16 2256  TROPONINI  0.24* 0.40* 0.47*     HbA1C:  Recent Labs  03/09/16 1126  HGBA1C 6.8*    CBG:  Recent Labs Lab 03/10/16 0751 03/10/16 1248 03/10/16 1558 03/10/16 2217 03/11/16 0741  GLUCAP 187* 161* 111* 104* 86     Recent Results (from the past 240 hour(s))  Urine culture     Status: None   Collection Time: 03/09/16  7:46 AM  Result Value Ref Range Status   Specimen Description URINE, RANDOM  Final   Special Requests NONE  Final   Culture NO GROWTH Performed at [facility]   Final   Report Status 03/10/2016 FINAL  Final  Blood Culture (routine x 2)     Status: None (Preliminary result)   Collection Time: 03/09/16 10:26 AM  Result Value Ref Range Status   Specimen Description BLOOD LEFT HAND  Final   Special Requests IN PEDIATRIC BOTTLE 3CC  Final   Culture   Final    NO GROWTH 1 DAY Performed at [facility]    Report Status PENDING  Incomplete  Blood Culture (routine x 2)     Status: None (Preliminary result)   Collection Time: 03/09/16 10:26 AM  Result Value Ref Range Status   Specimen Description BLOOD LEFT HAND  Final   Special Requests IN PEDIATRIC BOTTLE 3CC  Final   Culture   Final    NO GROWTH 1 DAY Performed at [facility]    Report Status PENDING  Incomplete  MRSA PCR Screening     Status: None   Collection Time: 03/09/16 11:43 AM  Result Value Ref Range Status   MRSA by PCR NEGATIVE NEGATIVE Final    Comment:        The GeneXpert MRSA Assay (FDA approved for NASAL specimens only), is one component of a comprehensive MRSA colonization surveillance program. It is not intended to diagnose MRSA infection nor to guide or monitor treatment for MRSA infections.       Radiology Studies: Dg Chest Port 1 View  Result Date: 03/10/2016 CLINICAL DATA:  Shortness of breath, dyspnea, difficulty breathing, remote history of lung cancer EXAM: PORTABLE CHEST 1 VIEW COMPARISON:  03/09/2016 FINDINGS: Diffuse asymmetric interstitial opacities  throughout the lungs compatible with persistent edema. Small pleural effusions noted. There is associated bibasilar atelectasis. Stable mild cardiac enlargement. No pneumothorax. Left subclavian pacemaker noted. Trachea is midline. Atherosclerosis of the aorta. IMPRESSION: Persistent interstitial edema pattern and pleural effusions compatible with CHF. Electronically Signed   By: Jerilynn Mages.  Shick M.D.   On: 03/10/2016 08:44     Medications:  Scheduled: . aspirin EC  81 mg Oral Daily  . ceFEPime (MAXIPIME) IV  500 mg Intravenous Q24H  . chlorhexidine  15 mL  Mouth Rinse BID  . cloNIDine  0.2 mg Oral BID  . enoxaparin (LOVENOX) injection  30 mg Subcutaneous Q24H  . furosemide  80 mg Intravenous Q8H  . hydrALAZINE  100 mg Oral TID  . insulin aspart  0-9 Units Subcutaneous TID WC  . insulin glargine  10 Units Subcutaneous Q2200  . mouth rinse  15 mL Mouth Rinse q12n4p  . metoprolol succinate  200 mg Oral Daily  . NIFEdipine  90 mg Oral Daily  . pantoprazole  40 mg Oral Daily  . vancomycin  500 mg Intravenous Q48H   Continuous: . sodium chloride Stopped (03/10/16 1600)  . nitroGLYCERIN Stopped (03/10/16 1525)   [id number], hydrALAZINE, LORazepam, ondansetron **OR** ondansetron (ZOFRAN) IV  Assessment/Plan:  Active Problems:   CKD (chronic kidney disease)   Malignant neoplasm of liver (HCC)   Type 2 diabetes mellitus without complication, with long-term current use of insulin (HCC)   Essential hypertension   Acute on chronic congestive heart failure ([place])   Acute respiratory failure (HCC)    Acute hypoxic respiratory failure Likely multifactorial including congestive heart failure and possible pneumonia. Patient was also noted to be febrile. Influenza PCR was negative. She was taken off of BiPAP yesterday and seems to be doing much better today. Cultures have been negative so far. Okay to stop vancomycin. Continue with cefepime. Continue with diuresis.   Acute systolic congestive  heart failure. Echocardiogram did show systolic dysfunction. Her records from [place] were reviewed. Should she had an echo back in July, which actually showed EF of 50% at that time. So, she seems to have developed a new systolic dysfunction since then. Cardiology was consulted yesterday and is following. Dose of Lasix was increased yesterday and she seems to have diuresed some more today. She continues to have crackles in her lungs. Continue high dose Lasix for now. Rising creatinine is noted. Strict ins and outs. Daily weights. Currently off of nitroglycerin infusion.   Acute on Chronic kidney disease likely stage III with hypokalemia Baseline creatinine appears to be around 2.1-2.4. Creatinine is noted to be higher today compared to yesterday. She is also developing metabolic acidosis. She does have reasonably good urine output. Apparently has been seen by nephrology in [place]. Dialysis was discussed and patient does not want to go on dialysis. However, she may still benefit from seeing a nephrologist. We will consult while she is in the hospital. Potassium is normal today.  Mildly elevated troponin. Likely due to congestive heart failure and chronic kidney disease. Cardiology is following. See echocardiogram report as above.  Questionable healthcare associated pneumonia. Patient has had fever. Cultures are all negative so far. Okay to discontinue vancomycin. Continue cefepime. Influenza PCR was negative. Repeat chest x-ray showed persistent bilateral infiltrate suggesting pulmonary edema.  Metastatic liver cancer. Patient has a history of metastatic hepatocellular carcinoma. She is status post ablation. Currently not receiving any treatment. There is also evidence that she has some kind of an intraventricular septal lesion which is thought to be secondary to her metastatic liver cancer. This was based off of cardiac MRI that was done in [place].  Diabetes mellitus type 2. Continue with insulin,  sliding scale coverage. Monitor CBGs.  Accelerated hypertension. Patient's blood pressures have improved. She is back on oral agents. Nitroglycerin infusion was weaned off yesterday.   Microcytic anemia. Baseline hemoglobin is not known. No overt bleeding noted. Continue to monitor hemoglobin. Anemia panel is pending.  Old medical records from [place] were reviewed.  Patient apparently has  had chronic kidney disease. She was hospitalized sometime over the summer for complete heart block and that's apparently the reason for pacemaker placement. There is also evidence that she has some kind of an intraventricular septal lesion which is thought to be secondary to her metastatic liver cancer. This was based off of cardiac MRI that was done in [place]. She does have chronic anemia.   DVT Prophylaxis: Lovenox    Code Status: DO NOT RESUSCITATE. Per daughter's request, I discussed this issue again with the patient today, now that she is more lucid. She is very certain that she does not want to be intubated, but is not certain about resuscitation. She says she'll talk to her daughter and get back to [place]. She declines palliative medicine input. Family Communication: Discussed with the patient's daughter  Disposition Plan: Patient seems to be improving slowly. Consult nephrology. If she remains stable for most of the day today could be transferred to telemetry later today.    LOS: 2 days   [facility] Pager [phone] 03/11/2016, 7:51 AM  If 7PM-7AM, please contact night-coverage at [email or web address], password [facility]

## 2016-03-12 ENCOUNTER — Inpatient Hospital Stay (HOSPITAL_COMMUNITY): Payer: Medicare Other

## 2016-03-12 DIAGNOSIS — N179 Acute kidney failure, unspecified: Secondary | ICD-10-CM

## 2016-03-12 LAB — CBC
HEMATOCRIT: 22.9 % — AB (ref 36.0–46.0)
Hemoglobin: 8 g/dL — ABNORMAL LOW (ref 12.0–15.0)
MCH: 26.4 pg (ref 26.0–34.0)
MCHC: 34.9 g/dL (ref 30.0–36.0)
MCV: 75.6 fL — AB (ref 78.0–100.0)
Platelets: 320 10*3/uL (ref 150–400)
RBC: 3.03 MIL/uL — ABNORMAL LOW (ref 3.87–5.11)
RDW: 16.6 % — AB (ref 11.5–15.5)
WBC: 10.9 10*3/uL — ABNORMAL HIGH (ref 4.0–10.5)

## 2016-03-12 LAB — BASIC METABOLIC PANEL
Anion gap: 8 (ref 5–15)
BUN: 60 mg/dL — AB (ref 6–20)
CALCIUM: 8.5 mg/dL — AB (ref 8.9–10.3)
CO2: 21 mmol/L — AB (ref 22–32)
CREATININE: 3.41 mg/dL — AB (ref 0.44–1.00)
Chloride: 109 mmol/L (ref 101–111)
GFR calc Af Amer: 14 mL/min — ABNORMAL LOW (ref >60)
GFR calc non Af Amer: 12 mL/min — ABNORMAL LOW (ref >60)
GLUCOSE: 91 mg/dL (ref 65–99)
Potassium: 3.4 mmol/L — ABNORMAL LOW (ref 3.5–5.1)
Sodium: 138 mmol/L (ref 135–145)

## 2016-03-12 LAB — GLUCOSE, CAPILLARY
GLUCOSE-CAPILLARY: 98 mg/dL (ref 65–99)
GLUCOSE-CAPILLARY: 150 mg/dL — AB (ref 65–99)
Glucose-Capillary: 145 mg/dL — ABNORMAL HIGH (ref 65–99)

## 2016-03-12 MED ORDER — FUROSEMIDE 10 MG/ML IJ SOLN
80.0000 mg | Freq: Three times a day (TID) | INTRAMUSCULAR | Status: DC
  Administered 2016-03-12 – 2016-03-13 (×3): 80 mg via INTRAVENOUS
  Filled 2016-03-12 (×3): qty 8

## 2016-03-12 MED ORDER — LORAZEPAM 0.5 MG PO TABS
0.5000 mg | ORAL_TABLET | Freq: Four times a day (QID) | ORAL | Status: DC | PRN
  Administered 2016-03-12 – 2016-03-13 (×2): 0.5 mg via ORAL
  Filled 2016-03-12 (×2): qty 1

## 2016-03-12 MED ORDER — METOPROLOL SUCCINATE ER 100 MG PO TB24
100.0000 mg | ORAL_TABLET | Freq: Every day | ORAL | Status: DC
  Administered 2016-03-12: 100 mg via ORAL
  Filled 2016-03-12: qty 4

## 2016-03-12 MED ORDER — HYDRALAZINE HCL 25 MG PO TABS
25.0000 mg | ORAL_TABLET | Freq: Three times a day (TID) | ORAL | Status: DC
  Administered 2016-03-12 – 2016-03-14 (×6): 25 mg via ORAL
  Filled 2016-03-12 (×7): qty 1

## 2016-03-12 MED ORDER — NIFEDIPINE ER 30 MG PO TB24
30.0000 mg | ORAL_TABLET | Freq: Every day | ORAL | Status: DC
  Administered 2016-03-13 – 2016-03-14 (×2): 30 mg via ORAL
  Filled 2016-03-12 (×3): qty 1

## 2016-03-12 MED ORDER — METOPROLOL SUCCINATE ER 25 MG PO TB24
25.0000 mg | ORAL_TABLET | Freq: Every day | ORAL | Status: DC
  Administered 2016-03-13 – 2016-03-14 (×2): 25 mg via ORAL
  Filled 2016-03-12 (×2): qty 1

## 2016-03-12 NOTE — Progress Notes (Signed)
OT Cancellation Note  Patient Details Name: Anna Gray MRN: [id number] DOB: 1941-03-31   Cancelled Treatment:    Reason Eval/Treat Not Completed: Other (comment).  Pt is in the middle of her meal.  Will try to check back tomorrow if schedule permits, otherwise on Monday.  Delonte Musich 03/12/2016, 3:00 PM  Lesle Chris, OTR/L [phone] 03/12/2016

## 2016-03-12 NOTE — Progress Notes (Signed)
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 She was some found to have acute congestive heart failure, but also was febrile. She was hospitalized for further management.  Reason for Visit: Acute CHF  Consultants: Cardiology. Nephrology.  Procedures:  Transthoracic echocardiogram Study Conclusions - Left ventricle: The cavity size was normal. Wall thickness was   increased in a pattern of moderate LVH. Systolic function was   moderately to severely reduced. The estimated ejection fraction   was in the range of 30% to 35%. Akinesis of the anterior and   apical myocardium. - Aortic valve: There was mild regurgitation. - Mitral valve: There was mild to moderate regurgitation. - Left atrium: The atrium was mildly dilated. - Right atrium: The atrium was mildly dilated. - Tricuspid valve: There was mild-moderate regurgitation. - Pulmonary arteries: Systolic pressure was moderately increased.   PA peak pressure: 66 mm Hg (S).  Antibiotics: Vancomycin stopped on 10/6 Cefepime  Subjective/Interval History: Patient is awake, alert. She states that her breathing is much improved. She denies any nausea or vomiting. Overnight, she changed her CODE STATUS to full code. Still not interested in talking to palliative medicine.   ROS: Denies any chest pain.  Objective:  Vital Signs  Vitals:   03/12/16 0200 03/12/16 0300 03/12/16 0358 03/12/16 0400  BP: (!) 113/51 (!) 109/47  (!) 108/46  Pulse:      Resp: 20 18   18   Temp: (!) 96.8 F (36 C) 99 F (37.2 C) 99.1 F (37.3 C) 99 F (37.2 C)  TempSrc: Core (Comment) Core (Comment) Core (Comment)   SpO2: 99% 99%  100%  Weight:      Height:        Intake/Output Summary (Last 24 hours) at 03/12/16 0730 Last data filed at 03/12/16 0600  Gross per 24 hour  Intake              310 ml  Output             1620 ml  Net            -1310 ml   Filed Weights   03/09/16 1600 03/10/16 1600  Weight: 50.8 kg (111 lb 15.9 oz) 50.8 kg (111 lb 15.9 oz)    General appearance: Awake, alert. No distress.Marland Kitchen Resp: Crackles noted bilateral bases. No wheezing. No rhonchi. Cardio: regular rate and rhythm, S1, S2 normal, no murmur, click, rub or gallop GI: soft, non-tender; bowel sounds normal; no masses,  no organomegaly Extremities: No significant edema noted bilateral lower extremities. Neurological: Oriented 3. Cranial nerves II-12 intact. Motor strength equal bilateral upper and lower extremities.  Lab Results:  Data Reviewed: I have personally reviewed following labs and imaging studies  CBC:  Recent Labs Lab 03/09/16 0701 03/09/16 0708 03/10/16 0336 03/11/16 0318 03/12/16 0404  WBC 18.4*  --  15.3* 13.1* 10.9*  NEUTROABS 9.2*  --   --   --   --   HGB 10.5* 11.2* 9.4* 8.8* 8.0*  HCT 31.2* 33.0* 27.1* 25.2* 22.9*  MCV 77.0*  --  76.1* 75.9* 75.6*  PLT 474*  --  [phone] [id number]    Basic Metabolic Panel:  Recent Labs Lab 03/09/16 0701 03/09/16 0708 03/10/16 0336 03/11/16 0318 03/12/16 0404  NA 140 142 141 140 138  K 3.5 3.8 3.1* 3.7 3.4*  CL 105 106 110 112* 109  CO2 22  --  22 20* 21*  GLUCOSE 235* 228* 158* 78 91  BUN 44* 55* 52* 54* 60*  CREATININE 2.37* 2.30* 2.78* 2.95* 3.41*  CALCIUM 9.2  --  8.7* 8.8* 8.5*    GFR: Estimated Creatinine Clearance: 11.3 mL/min (by C-G formula based on SCr of 3.41 mg/dL (H)).  Liver Function Tests:  Recent Labs Lab 03/09/16 0701 03/10/16 0336  AST 56* 50*  ALT 23 25  ALKPHOS 203* 157*    BILITOT 0.5 0.5  PROT 8.8* 7.0  ALBUMIN 3.3* 2.7*    Cardiac Enzymes:  Recent Labs Lab 03/09/16 1126 03/09/16 1721 03/09/16 2256  TROPONINI 0.24* 0.40* 0.47*     HbA1C:  Recent Labs  03/09/16 1126  HGBA1C 6.8*    CBG:  Recent Labs Lab 03/10/16 2217 03/11/16 0741 03/11/16 1151 03/11/16 1546 03/11/16 2203  GLUCAP 104* 86 94 99 165*     Recent Results (from the past 240 hour(s))  Urine culture     Status: None   Collection Time: 03/09/16  7:46 AM  Result Value Ref Range Status   Specimen Description URINE, RANDOM  Final   Special Requests NONE  Final   Culture NO GROWTH Performed at [facility]   Final   Report Status 03/10/2016 FINAL  Final  Blood Culture (routine x 2)     Status: None (Preliminary result)   Collection Time: 03/09/16 10:26 AM  Result Value Ref Range Status   Specimen Description BLOOD LEFT HAND  Final   Special Requests IN PEDIATRIC BOTTLE 3CC  Final   Culture   Final    NO GROWTH 2 DAYS Performed at [facility]    Report Status PENDING  Incomplete  Blood Culture (routine x 2)     Status: None (Preliminary result)   Collection Time: 03/09/16 10:26 AM  Result Value Ref Range Status   Specimen Description BLOOD LEFT HAND  Final   Special Requests IN PEDIATRIC BOTTLE 3CC  Final   Culture   Final    NO GROWTH 2 DAYS Performed at [facility]    Report Status PENDING  Incomplete  MRSA PCR Screening     Status: None   Collection Time: 03/09/16 11:43 AM  Result Value Ref Range Status   MRSA by PCR NEGATIVE NEGATIVE Final    Comment:        The GeneXpert MRSA Assay (FDA approved for NASAL specimens only), is one component of a comprehensive MRSA colonization surveillance program. It is not intended to diagnose MRSA infection nor to guide or monitor treatment for MRSA infections.       Radiology Studies: Dg Chest Port 1 View  Result Date: 03/10/2016 CLINICAL DATA:  Shortness of breath, dyspnea,  difficulty breathing, remote history of lung cancer EXAM: PORTABLE CHEST 1 VIEW COMPARISON:  03/09/2016 FINDINGS: Diffuse asymmetric interstitial opacities throughout the lungs compatible with persistent edema. Small pleural effusions noted. There is associated bibasilar atelectasis. Stable mild cardiac enlargement. No pneumothorax. Left subclavian pacemaker noted. Trachea is midline. Atherosclerosis of the aorta. IMPRESSION: Persistent interstitial edema pattern and pleural effusions compatible with CHF. Electronically Signed   By: Jerilynn Mages.  Shick M.D.   On: 03/10/2016 08:44     Medications:  Scheduled: . aspirin EC  81 mg Oral Daily  . ceFEPime (MAXIPIME) IV  500 mg Intravenous Q24H  . chlorhexidine  15 mL Mouth Rinse BID  . cloNIDine  0.2 mg Oral BID  . enoxaparin (LOVENOX) injection  30 mg Subcutaneous Q24H  . hydrALAZINE  100 mg Oral TID  . insulin aspart  0-9 Units Subcutaneous TID WC  . insulin glargine  10 Units Subcutaneous Q2200  . isosorbide mononitrate  30 mg Oral Daily  . mouth rinse  15 mL Mouth Rinse q12n4p  . metoprolol succinate  100 mg Oral Daily  . NIFEdipine  90 mg Oral Daily  . pantoprazole  40 mg Oral Daily  . torsemide  40 mg Oral Daily   Continuous: . sodium chloride Stopped (03/10/16 1600)  . nitroGLYCERIN Stopped (03/10/16 1525)   [id number], hydrALAZINE, LORazepam, ondansetron **OR** ondansetron (ZOFRAN) IV, oxyCODONE  Assessment/Plan:  Active Problems:   CKD (chronic kidney disease)   Malignant neoplasm of liver (HCC)   Type 2 diabetes mellitus without complication, with long-term current use of insulin (HCC)   Essential hypertension   Acute on chronic congestive heart failure (HCC)   Acute respiratory failure (HCC)    Acute hypoxic respiratory failure Likely multifactorial including congestive heart failure and possible pneumonia. Patient was also noted to be febrile. Influenza PCR was negative. Patient has been doing well off of BiPAP. Cultures  are all negative so far. Vancomycin was discontinued on 10/6. Continue cefepime for now. She was also given intravenous Lasix with good diuresis.   Acute systolic congestive heart failure. Echocardiogram did show systolic dysfunction. Her records from [place] were reviewed. She had an echo back in July, which actually showed EF of 50% at that time. So, she seems to have developed a new systolic dysfunction since then. Cardiology was consulted and they are following. She has been taken off of her intravenous Lasix and placed on oral torsemide. Continue strict ins and outs and daily weights. She is also off of her nitroglycerin infusion.   Acute on Chronic kidney disease likely stage III with hypokalemia Baseline creatinine appears to be around 2.1-2.4 based on records from [place]. Creatinine is noted to be rising. This could be due to aggressive diuresis. She is making urine. Will get renal ultrasound. We have already consulted nephrology and they will see her today. Defer management of hypokalemia to nephrologists at this time. Apparently has been seen by nephrology in [place]. Dialysis was discussed and patient does not want to go on dialysis.   Mildly elevated troponin. Likely due to congestive heart failure and chronic kidney disease. Cardiology is following. See echocardiogram report as above.  Questionable healthcare associated pneumonia. Patient has had fever. Cultures are all negative so far. Influenza PCR was negative. Repeat chest x-ray showed persistent bilateral infiltrate suggesting pulmonary edema.  Metastatic liver cancer. Patient has a history of metastatic hepatocellular carcinoma. She is status post ablation. Currently not receiving any treatment. There is also evidence that she has some kind of an intraventricular septal lesion which is thought to be secondary to her metastatic liver cancer. This was based on a cardiac MRI that was done in [place].  Diabetes mellitus type  2. Continue with insulin, sliding scale coverage. Monitor CBGs.  Accelerated hypertension. Patient's blood pressures have improved. She is back on oral agents. Nitroglycerin infusion was weaned off yesterday. Blood pressure is noted to be somewhat low. We will cut back on the dose of her metoprolol.  Microcytic anemia. Baseline hemoglobin is  not known. No overt bleeding noted. Continue to monitor hemoglobin. Anemia panel is reviewed.  DVT Prophylaxis: Lovenox    Code Status: Patient has changed her CODE STATUS to full code. She is not interested in talking to palliative medicine team at this time.  Family Communication: Discussed with the patient's daughter  Disposition Plan: Patient has been improving. Continue current treatment. Start mobilizing. Okay for transfer to telemetry. Await nephrology input.     LOS: 3 days   [facility] Pager [phone] 03/12/2016, 7:30 AM  If 7PM-7AM, please contact night-coverage at [email or web address], password [facility]

## 2016-03-12 NOTE — Consult Note (Signed)
Renal Service Consult Note [facility]  Anna Gray 03/12/2016 Sol Blazing Requesting Physician:  Dr [place] Pink  Reason for Consult:  CKD w vol overload and diuresing HPI: The patient is a 75 y.o. year-old with history of HTN, DM, CHF and CKD, hx liver cancer with lung mets (dx'd in 2009).  She is/was treated with RFA and chemoembolization. Is on 2L [place] oxygen due to metastases.  Was admitted with CHF/ pulm edema/ dyspnea  On 10/4 and has been getting IV diuresis w good results.  SOB is much better.  Creat up a little 2.9 > 3.4 in hospital. Has has about 1.6 L UOP per day since arriving and wt is 50 > 47kg. We are to asked to see for CKD.  She says she has not seen kidney doctors before.     Patient grew up in [place], [place], worked various jobs as Network engineer, in [employer], Dietitian.  Married , had 3 children, got divorced age 52.  Was dx's w/ liver cancer in 2009. See above. She says she was visiting her son in [place] earlier this year and got sick and has been in the hosptial or Rehab center(s) since in [place].  In September she moved here to live with her daughter and their family.  She still wants to get back to [place] which she still consider her home.    Says she is seeing an oncologist with "MRI every 3 mos" and they do "ablations" if they see anything.  Has known lung mets on home O2.  Quit drinking etoh 15 yrs ago.  PCP is Dr Gerlean Ren, has not seen yet.         Date  Creat  eGFR 03/02/16 2.66  03/09/16 2.37  19 03/11/16 2.95  15  03/12/16 3.41  12    ROS  denies CP  no joint pain   no HA  no blurry vision  no rash  no diarrhea  no nausea/ vomiting  no dysuria  no difficulty voiding  no change in urine color    Past Medical History  Past Medical History:  Diagnosis Date  . Anxiety   . CHF (congestive heart failure) ([place])   . Depression   . Diabetes mellitus without complication ([place])   . Hypertension   . Presence of permanent cardiac  pacemaker    Past Surgical History  Past Surgical History:  Procedure Laterality Date  . INSERT / REPLACE / REMOVE PACEMAKER     Family History History reviewed. No pertinent family history. Social History  reports that she has quit smoking. Her smoking use included Cigarettes. She has never used smokeless tobacco. She reports that she does not drink alcohol or use drugs. Allergies  Allergies  Allergen Reactions  . Vicodin [Hydrocodone-Acetaminophen] Nausea And Vomiting   Home medications Prior to Admission medications   Medication Sig Start Date End Date Taking? Authorizing Provider  albuterol (PROVENTIL) (2.5 MG/3ML) 0.083% nebulizer solution Take 2.5 mg by nebulization every 2 (two) hours as needed for wheezing or shortness of breath.   Yes Historical Provider, MD  cloNIDine (CATAPRES) 0.2 MG tablet Take 1 tablet (0.2 mg total) by mouth 2 (two) times daily. 03/02/16  Yes [place] N Rumley, DO  hydrALAZINE (APRESOLINE) 100 MG tablet Take 1 tablet (100 mg total) by mouth 3 (three) times daily. 03/02/16  Yes [place] N Rumley, DO  HYDROmorphone (DILAUDID) 2 MG tablet Take 2-4 mg by mouth every 12 (twelve) hours as needed for severe pain.  Yes Historical Provider, MD  isosorbide mononitrate (IMDUR) 30 MG 24 hr tablet Take 1 tablet (30 mg total) by mouth daily. 03/02/16  Yes [place] N Rumley, DO  LORazepam (ATIVAN) 0.5 MG tablet Take 0.5 mg by mouth every 8 (eight) hours as needed for anxiety.   Yes Historical Provider, MD  metoprolol succinate (TOPROL-XL) 100 MG 24 hr tablet Take 1 tablet (100 mg total) by mouth daily. Take with or immediately following a meal. 03/02/16  Yes [place] N Rumley, DO  Multiple Vitamin (MULTIVITAMIN WITH MINERALS) TABS tablet Take 1 tablet by mouth daily.   Yes Historical Provider, MD  NIFEdipine (PROCARDIA XL/ADALAT-CC) 90 MG 24 hr tablet Take 1 tablet (90 mg total) by mouth daily. 03/02/16  Yes [place] N Rumley, DO  ondansetron (ZOFRAN-ODT) 4 MG disintegrating tablet  Take 4 mg by mouth every 8 (eight) hours as needed for nausea or vomiting.   Yes Historical Provider, MD  pantoprazole (PROTONIX) 40 MG tablet Take 1 tablet (40 mg total) by mouth daily. 03/02/16  Yes [place] N Rumley, DO  simethicone (MYLICON) 80 MG chewable tablet Chew 80 mg by mouth 4 (four) times daily as needed for flatulence.   Yes Historical Provider, MD  torsemide (DEMADEX) 20 MG tablet Take 1 tablet (20 mg total) by mouth daily. 03/02/16  Yes [place] N Rumley, DO  aspirin EC 81 MG tablet Take 1 tablet (81 mg total) by mouth daily. Patient not taking: Reported on 03/09/2016 03/02/16   Burna Cash Rumley, DO  Insulin Glargine (LANTUS) 100 UNIT/ML Solostar Pen Inject 10 Units into the skin daily at 10 pm. 03/02/16   Lorna Few, DO   Liver Function Tests  Recent Labs Lab 03/09/16 0701 03/10/16 0336  AST 56* 50*  ALT 23 25  ALKPHOS 203* 157*  BILITOT 0.5 0.5  PROT 8.8* 7.0  ALBUMIN 3.3* 2.7*   No results for input(s): LIPASE, AMYLASE in the last 168 hours. CBC  Recent Labs Lab 03/09/16 0701  03/10/16 0336 03/11/16 0318 03/12/16 0404  WBC 18.4*  --  15.3* 13.1* 10.9*  NEUTROABS 9.2*  --   --   --   --   HGB 10.5*  < > 9.4* 8.8* 8.0*  HCT 31.2*  < > 27.1* 25.2* 22.9*  MCV 77.0*  --  76.1* 75.9* 75.6*  PLT 474*  --  393 348 320  < > = values in this interval not displayed. Basic Metabolic Panel  Recent Labs Lab 03/09/16 0701 03/09/16 0708 03/10/16 0336 03/11/16 0318 03/12/16 0404  NA 140 142 141 140 138  K 3.5 3.8 3.1* 3.7 3.4*  CL 105 106 110 112* 109  CO2 22  --  22 20* 21*  GLUCOSE 235* 228* 158* 78 91  BUN 44* 55* 52* 54* 60*  CREATININE 2.37* 2.30* 2.78* 2.95* 3.41*  CALCIUM 9.2  --  8.7* 8.8* 8.5*   Iron/TIBC/Ferritin/ %Sat    Component Value Date/Time   IRON 73 03/11/2016 0318   TIBC 237 (L) 03/11/2016 0318   FERRITIN 110 03/11/2016 0318   IRONPCTSAT 31 03/11/2016 0318    Vitals:   03/12/16 0800 03/12/16 0900 03/12/16 1000 03/12/16 1150  BP:  (!) 135/56 131/60 (!) 116/54 122/60  Pulse:    64  Resp: 19 17 19 16   Temp: 98.8 F (37.1 C)   98.7 F (37.1 C)  TempSrc:    Oral  SpO2: 97% 92% 90% 100%  Weight:    47.4 kg (104 lb 8 oz)  Height:    5' 2"  (1.575 m)   Exam Gen frail elderly female, no distress, pleasant No rash, cyanosis or gangrene Sclera anicteric, throat clear  No jvd or bruits Chest coarse insp rales bilat bases 1/3 up RRR 2/6 SEM no rub / gallop Abd soft ntnd no mass or ascites +bs no hsm GU  defer MS no joint effusions or deformity Ext no LE or UE edema / no wounds or ulcers Neuro is alert, Ox 3 , nf, gen weakness, but able to get up by herself in bed  CXR 10/5, 10/4 >> pulm edema Renal US >> echogenic kidneys , 9.5- 10 cm bilat, no hydro  Assessment: 1. CKD stage IV - don't have any old records, not sure what her history is w regard to renal failure.  She just moved here from [place].  Would not do extensive w/u, expect she has established disease. Will help w managing diuresis.  Not a candidate for dialysis given severe comorbidities.  She is pleased with that as she doesn't want to do HD anyways.  2. DM  3. HTN 4. PPM 5. CHF - LVEF 30-35%, +akinesis of anterior and apical walls.  She still has rales post.  6. HTN - BP's dropping w diuresis, need to lower BP meds 7. Metastatic liver cancer  Plan - repeat CXR, resume IV lasix 80 q 8 hrs, get more fluid off, creat will increase some more which is expected.   Kelly Splinter MD [employer] pager [phone]   03/12/2016, 4:16 PM

## 2016-03-12 NOTE — Progress Notes (Signed)
Patient Name: Anna Gray Date of Encounter: 03/12/2016     Active Problems:   CKD (chronic kidney disease)   Malignant neoplasm of liver ([place])   Type 2 diabetes mellitus without complication, with long-term current use of insulin ([facility])   Essential hypertension   Acute on chronic congestive heart failure ([place])   Acute respiratory failure ([facility])    SUBJECTIVE  No chest pain or sob. 'When can I go home"  CURRENT MEDS . aspirin EC  81 mg Oral Daily  . ceFEPime (MAXIPIME) IV  500 mg Intravenous Q24H  . chlorhexidine  15 mL Mouth Rinse BID  . cloNIDine  0.2 mg Oral BID  . enoxaparin (LOVENOX) injection  30 mg Subcutaneous Q24H  . hydrALAZINE  100 mg Oral TID  . insulin aspart  0-9 Units Subcutaneous TID WC  . insulin glargine  10 Units Subcutaneous Q2200  . isosorbide mononitrate  30 mg Oral Daily  . mouth rinse  15 mL Mouth Rinse q12n4p  . metoprolol succinate  100 mg Oral Daily  . NIFEdipine  90 mg Oral Daily  . pantoprazole  40 mg Oral Daily  . torsemide  40 mg Oral Daily    OBJECTIVE  Vitals:   03/12/16 0200 03/12/16 0300 03/12/16 0358 03/12/16 0400  BP: (!) 113/51 (!) 109/47  (!) 108/46  Pulse:      Resp: 20 18  18   Temp: (!) 96.8 F (36 C) 99 F (37.2 C) 99.1 F (37.3 C) 99 F (37.2 C)  TempSrc: Core (Comment) Core (Comment) Core (Comment)   SpO2: 99% 99%  100%  Weight:      Height:        Intake/Output Summary (Last 24 hours) at 03/12/16 0815 Last data filed at 03/12/16 0600  Gross per 24 hour  Intake              310 ml  Output             1395 ml  Net            -1085 ml   Filed Weights   03/09/16 1600 03/10/16 1600  Weight: 111 lb 15.9 oz (50.8 kg) 111 lb 15.9 oz (50.8 kg)    PHYSICAL EXAM  General: Pleasant, frail appearing, NAD. Neuro: Alert and oriented X 3. Moves all extremities spontaneously. Psych: Normal affect. HEENT:  Normal  Neck: Supple without bruits or JVD. Lungs:  Resp regular and unlabored, CTA. Heart: RRR no s3, s4,  or murmurs. Abdomen: Soft, non-tender, non-distended, BS + x 4.  Extremities: No clubbing, cyanosis or edema. DP/PT/Radials 2+ and equal bilaterally.  Accessory Clinical Findings  CBC  Recent Labs  03/11/16 0318 03/12/16 0404  WBC 13.1* 10.9*  HGB 8.8* 8.0*  HCT 25.2* 22.9*  MCV 75.9* 75.6*  PLT 348 [id number]   Basic Metabolic Panel  Recent Labs  03/11/16 0318 03/12/16 0404  NA 140 138  K 3.7 3.4*  CL 112* 109  CO2 20* 21*  GLUCOSE 78 91  BUN 54* 60*  CREATININE 2.95* 3.41*  CALCIUM 8.8* 8.5*   Liver Function Tests  Recent Labs  03/10/16 0336  AST 50*  ALT 25  ALKPHOS 157*  BILITOT 0.5  PROT 7.0  ALBUMIN 2.7*   No results for input(s): LIPASE, AMYLASE in the last 72 hours. Cardiac Enzymes  Recent Labs  03/09/16 1126 03/09/16 1721 03/09/16 2256  TROPONINI 0.24* 0.40* 0.47*   BNP Invalid input(s): POCBNP D-Dimer No results for input(s):  DDIMER in the last 72 hours. Hemoglobin A1C  Recent Labs  03/09/16 1126  HGBA1C 6.8*   Fasting Lipid Panel No results for input(s): CHOL, HDL, LDLCALC, TRIG, CHOLHDL, LDLDIRECT in the last 72 hours. Thyroid Function Tests No results for input(s): TSH, T4TOTAL, T3FREE, THYROIDAB in the last 72 hours.  Invalid input(s): FREET3  TELE  nsr  Radiology/Studies  Dg Chest Port 1 View  Result Date: 03/10/2016 CLINICAL DATA:  Shortness of breath, dyspnea, difficulty breathing, remote history of lung cancer EXAM: PORTABLE CHEST 1 VIEW COMPARISON:  03/09/2016 FINDINGS: Diffuse asymmetric interstitial opacities throughout the lungs compatible with persistent edema. Small pleural effusions noted. There is associated bibasilar atelectasis. Stable mild cardiac enlargement. No pneumothorax. Left subclavian pacemaker noted. Trachea is midline. Atherosclerosis of the aorta. IMPRESSION: Persistent interstitial edema pattern and pleural effusions compatible with CHF. Electronically Signed   By: Jerilynn Mages.  Shick M.D.   On: 03/10/2016 08:44     Dg Chest Port 1 View  Result Date: 03/09/2016 CLINICAL DATA:  Shortness of breath, difficulty breathing with increased symptoms since 5 a.m. today. History of lung malignancy, CHF, diabetes, chronic renal insufficiency. EXAM: PORTABLE CHEST 1 VIEW COMPARISON:  None in PACs FINDINGS: The lungs are hyperinflated. The interstitial markings are increased diffusely with areas of near confluence in the left mid lung. The heart is top-normal in size. The pulmonary vascularity is mildly prominent centrally. There is calcification in the wall of the aortic arch. The permanent pacemaker is in reasonable position radiographically. IMPRESSION: COPD. Interstitial edema or infiltrate compatible with CHF and/or pneumonia. Aortic atherosclerosis. Electronically Signed   By: David  Martinique M.D.   On: 03/09/2016 07:14    ASSESSMENT AND PLAN  1. Acute on chronic systolic heart failure - she appears euvolemic, possibly a little dry at this point. Would hold diuretic today. 2. Pre-renal azotemia - her creatinine has risen from 2.9 to 3.4. Would hold diuretic, maybe give a 250 cc bolus of ns. 3. Atrial tachycardia - she is back in nsr. Would not work this up. No good medical therapy option except AV nodal blocking drugs. Continue beta blocker. 4. Anemia - making all of the above worse. I will defer question of transfusion to primary team.  Carleene Overlie Amya Hlad,M.D.  03/12/2016 8:15 AMPatient ID: Patrica Duel, female   DOB: 1941-04-15, 75 y.o.   MRN: [id number]

## 2016-03-13 LAB — BASIC METABOLIC PANEL
Anion gap: 12 (ref 5–15)
BUN: 64 mg/dL — ABNORMAL HIGH (ref 6–20)
CALCIUM: 8.9 mg/dL (ref 8.9–10.3)
CHLORIDE: 103 mmol/L (ref 101–111)
CO2: 22 mmol/L (ref 22–32)
CREATININE: 3.42 mg/dL — AB (ref 0.44–1.00)
GFR calc Af Amer: 14 mL/min — ABNORMAL LOW (ref >60)
GFR calc non Af Amer: 12 mL/min — ABNORMAL LOW (ref >60)
GLUCOSE: 142 mg/dL — AB (ref 65–99)
Potassium: 3.4 mmol/L — ABNORMAL LOW (ref 3.5–5.1)
Sodium: 137 mmol/L (ref 135–145)

## 2016-03-13 LAB — CBC
HEMATOCRIT: 25.6 % — AB (ref 36.0–46.0)
HEMOGLOBIN: 8.9 g/dL — AB (ref 12.0–15.0)
MCH: 26.3 pg (ref 26.0–34.0)
MCHC: 34.8 g/dL (ref 30.0–36.0)
MCV: 75.7 fL — AB (ref 78.0–100.0)
Platelets: 364 10*3/uL (ref 150–400)
RBC: 3.38 MIL/uL — ABNORMAL LOW (ref 3.87–5.11)
RDW: 16.6 % — AB (ref 11.5–15.5)
WBC: 11.7 10*3/uL — ABNORMAL HIGH (ref 4.0–10.5)

## 2016-03-13 LAB — GLUCOSE, CAPILLARY
GLUCOSE-CAPILLARY: 127 mg/dL — AB (ref 65–99)
GLUCOSE-CAPILLARY: 134 mg/dL — AB (ref 65–99)
Glucose-Capillary: 194 mg/dL — ABNORMAL HIGH (ref 65–99)
Glucose-Capillary: 137 mg/dL — ABNORMAL HIGH (ref 65–99)

## 2016-03-13 MED ORDER — LEVOFLOXACIN 500 MG PO TABS: 500.0000 mg | ORAL_TABLET | ORAL | Status: DC

## 2016-03-13 MED ORDER — LEVOFLOXACIN 750 MG PO TABS
750.0000 mg | ORAL_TABLET | Freq: Once | ORAL | Status: AC
  Administered 2016-03-13: 750 mg via ORAL
  Filled 2016-03-13: qty 1

## 2016-03-13 MED ORDER — FUROSEMIDE 40 MG PO TABS
80.0000 mg | ORAL_TABLET | Freq: Two times a day (BID) | ORAL | Status: DC
  Administered 2016-03-14 (×2): 80 mg via ORAL
  Filled 2016-03-13 (×2): qty 2

## 2016-03-13 NOTE — Progress Notes (Signed)
Anna Gray Progress Note   Subjective: breathing good today. Wants to go home.     Vitals:   03/12/16 2137 03/13/16 0432 03/13/16 1458 03/13/16 1844  BP: (!) 155/82 (!) 139/57 (!) 160/66 (!) 142/60  Pulse: 80 71 82   Resp: 16 16 18    Temp: 98.4 F (36.9 C) 99.8 F (37.7 C) 98.8 F (37.1 C)   TempSrc: Axillary Oral Oral   SpO2: 100% 100% 100%   Weight:  46.7 kg (102 lb 14.4 oz)    Height:        Inpatient medications: . aspirin EC  81 mg Oral Daily  . chlorhexidine  15 mL Mouth Rinse BID  . enoxaparin (LOVENOX) injection  30 mg Subcutaneous Q24H  . furosemide  80 mg Intravenous Q8H  . hydrALAZINE  25 mg Oral TID  . insulin aspart  0-9 Units Subcutaneous TID WC  . insulin glargine  10 Units Subcutaneous Q2200  . isosorbide mononitrate  30 mg Oral Daily  . [START ON 03/15/2016] levofloxacin  500 mg Oral Q48H  . mouth rinse  15 mL Mouth Rinse q12n4p  . metoprolol succinate  25 mg Oral Daily  . NIFEdipine  30 mg Oral Daily  . pantoprazole  40 mg Oral Daily   . sodium chloride Stopped (03/10/16 1600)   acetaminophen, hydrALAZINE, LORazepam, ondansetron **OR** ondansetron (ZOFRAN) IV, oxyCODONE  Exam: Gen frail elderly female, no distress, pleasant No jvd or bruits Chest coarse insp rales bilat bases 1/3 up RRR 2/6 SEM no rub / gallop Abd firm somewhat distended, poss ascites MS no joint effusions or deformity Ext no LE or UE edema / no wounds or ulcers/ +musc wasting diffusely Neuro is alert, Ox 3 , nf, mild gen'd weakness  CXR 10/5, 10/4 >> pulm edema Renal US >> echogenic kidneys , 9.5- 10 cm bilat, no hydro  Assessment: 1. CKD stage IV - don't have any old records, not sure what her history is w regard to renal failure.  She just moved here from [place], and has been in and out of hospitals since July.  He metastatic liver cancer. Not a dialysis candidate, have discussed this with her.   Would not do extensive w/u, suspect she has established disease.   Creat stable 3.5 range now.  Looks good today, on room air.  Change to po lasix. Will sign off. Our office will contact her to establish renal f/u if she so desires.   2. DM  3. HTN 4. PPM 5. CHF - LVEF 30-35%, +akinesis of anterior and apical walls.  She still has rales post.  6. HTN - BP's low, reduced BP meds and BP normal-high now.  Cont meds 7. Metastatic liver cancer  Plan - as above   Kelly Splinter MD [facility] pager [phone]   03/13/2016, 7:57 PM    Recent Labs Lab 03/11/16 0318 03/12/16 0404 03/13/16 0543  NA 140 138 137  K 3.7 3.4* 3.4*  CL 112* 109 103  CO2 20* 21* 22  GLUCOSE 78 91 142*  BUN 54* 60* 64*  CREATININE 2.95* 3.41* 3.42*  CALCIUM 8.8* 8.5* 8.9    Recent Labs Lab 03/09/16 0701 03/10/16 0336  AST 56* 50*  ALT 23 25  ALKPHOS 203* 157*  BILITOT 0.5 0.5  PROT 8.8* 7.0  ALBUMIN 3.3* 2.7*    Recent Labs Lab 03/09/16 0701  03/11/16 0318 03/12/16 0404 03/13/16 0543  WBC 18.4*  < > 13.1* 10.9* 11.7*  NEUTROABS 9.2*  --   --   --   --  HGB 10.5*  < > 8.8* 8.0* 8.9*  HCT 31.2*  < > 25.2* 22.9* 25.6*  MCV 77.0*  < > 75.9* 75.6* 75.7*  PLT 474*  < > 348 320 364  < > = values in this interval not displayed. Iron/TIBC/Ferritin/ %Sat    Component Value Date/Time   IRON 73 03/11/2016 0318   TIBC 237 (L) 03/11/2016 0318   FERRITIN 110 03/11/2016 0318   IRONPCTSAT 31 03/11/2016 0318

## 2016-03-13 NOTE — Evaluation (Signed)
Physical Therapy Evaluation Patient Details Name: Anna Gray MRN: [id number] DOB: [birth year] Today's Date: 03/13/2016   History of Present Illness   75 year old African-American female with a past medical history of for diabetes, hypertension, 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 She was found to have acute congestive heart failure, but also was febrile  Clinical Impression  Pt admitted with above diagnosis. Pt currently with functional limitations due to the deficits listed below (see PT Problem List). * Pt will benefit from skilled PT to increase their independence and safety with mobility to allow discharge to the venue listed below.    Pt pleasant and cooperative; she requires min assist for balance during gait mainly bc she declines use of AD; will continue to follow      Follow Up Recommendations Home health PT;No PT follow up (vs-depending on progress)    Equipment Recommendations  None recommended by PT (unless pt agreeable to cane;  )    Recommendations for Other Services       Precautions / Restrictions Precautions Precautions: Fall      Mobility  Bed Mobility Overal bed mobility: Needs Assistance Bed Mobility: Supine to Sit     Supine to sit: Modified independent (Device/Increase time)        Transfers Overall transfer level: Needs assistance Equipment used: None Transfers: Sit to/from Stand Sit to Stand: Min guard         General transfer comment: assist to steady upon rising, cues for overall safety  Ambulation/Gait Ambulation/Gait assistance: Min assist Ambulation Distance (Feet): 100 Feet Assistive device: 1 person hand held assist Gait Pattern/deviations: Step-through pattern;Decreased stride  length;Narrow base of support Gait velocity: decr   General Gait Details: pt refuses use of RW; HHA needed for balance and safety throughout distance,  also attempts to touch rail intermittently  Stairs            Wheelchair Mobility    Modified Rankin (Stroke Patients Only)       Balance                                             Pertinent Vitals/Pain Pain Assessment: No/denies pain    Home Living Family/patient expects to be discharged to:: Private residence Living Arrangements: Children (dtr)   Type of Home: Apartment Home Access: Stairs to enter Entrance Stairs-Rails: Psychiatric nurse of Steps: flight Home Layout: One level Home Equipment: None      Prior Function Level of Independence: Independent               Hand Dominance        Extremity/Trunk Assessment   Upper Extremity Assessment: Generalized weakness           Lower Extremity Assessment: Generalized weakness         Communication   Communication: No difficulties  Cognition Arousal/Alertness: Awake/alert Behavior During Therapy: WFL for tasks assessed/performed Overall Cognitive Status: Within Functional Limits for tasks assessed                      General Comments      Exercises     Assessment/Plan    PT Assessment Patient  needs continued PT services  PT Problem List Decreased activity tolerance;Decreased balance;Decreased mobility          PT Treatment Interventions DME instruction;Gait training;Functional mobility training;Stair training;Therapeutic activities;Therapeutic exercise;Patient/family education    PT Goals (Current goals can be found in the Care Plan section)  Acute Rehab PT Goals Patient Stated Goal: home soon PT Goal Formulation: With patient Time For Goal Achievement: 03/20/16 Potential to Achieve Goals: Good    Frequency Min 3X/week   Barriers to discharge        Co-evaluation                End of Session   Activity Tolerance: Patient tolerated treatment well;Patient limited by fatigue Patient left: in chair;with call bell/phone within reach (no alarm avail--RN notified) Nurse Communication: Mobility status         Time: [id number] PT Time Calculation (min) (ACUTE ONLY): 16 min   Charges:   PT Evaluation $PT Eval Low Complexity: 1 Procedure     PT G Codes:        Burdette Gergely March 23, 2016, 3:58 PM

## 2016-03-13 NOTE — Progress Notes (Signed)
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 She was found to have acute congestive heart failure, but also was febrile. She was hospitalized for further management. Echocardiogram shows EF to be 30-35%. Cardiology was consulted. She was placed on high-dose diuretics. She also had rise in her creatinine. Nephrology was also consulted.  Reason for Visit: Acute CHF  Consultants: Cardiology. Nephrology.  Procedures:  Transthoracic echocardiogram Study Conclusions - Left ventricle: The cavity size was normal. Wall thickness was   increased in a pattern of moderate LVH. Systolic function was   moderately to severely reduced. The estimated ejection fraction   was in the range of 30% to 35%. Akinesis of the anterior and   apical myocardium. - Aortic valve: There was mild regurgitation. - Mitral valve: There was mild to moderate regurgitation. - Left atrium: The atrium was mildly dilated. - Right atrium: The atrium was mildly dilated. - Tricuspid valve: There was mild-moderate regurgitation. - Pulmonary arteries: Systolic pressure was moderately increased.   PA peak pressure: 66 mm Hg (S).  Antibiotics: Vancomycin stopped on 10/6 Cefepime stopped on 10/8 Oral Levaquin 10/8  Subjective/Interval History: Patient is awake, alert. She feels better. States that her breathing is improving. Denies any cough. Denies any nausea, vomiting. Her daughter is at the bedside.   ROS: Denies any chest  pain.  Objective:  Vital Signs  Vitals:   03/12/16 1000 03/12/16 1150 03/12/16 2137 03/13/16 0432  BP: (!) 116/54 122/60 (!) 155/82 (!) 139/57  Pulse:  64 80 71  Resp: 19 16 16 16   Temp:  98.7 F (37.1 C) 98.4 F (36.9 C) 99.8 F (37.7 C)  TempSrc:  Oral Axillary Oral  SpO2: 90% 100% 100% 100%  Weight:  47.4 kg (104 lb 8 oz)  46.7 kg (102 lb 14.4 oz)  Height:  5\' 2"  (1.575 m)      Intake/Output Summary (Last 24 hours) at 03/13/16 0755 Last data filed at 03/13/16 0700  Gross per 24 hour  Intake              960 ml  Output              150 ml  Net              810 ml   Filed Weights   03/10/16 1600 03/12/16 1150 03/13/16 0432  Weight: 50.8 kg (111 lb 15.9 oz) 47.4 kg (104 lb 8 oz) 46.7 kg (102 lb 14.4 oz)    General appearance: Awake, alert. No distress. Resp: Continues to have crackles bilaterally, although air entry appears to have improved. No wheezing.  Cardio: regular rate and rhythm, S1, S2 normal. Systolic murmur appreciated over the precordium. No click, rub or gallop GI: soft, non-tender; bowel sounds normal; no masses,  no organomegaly Extremities: No edema noted bilateral lower extremities. Neurological: Oriented 3. Cranial nerves II-12 intact. Motor strength equal bilateral upper and lower extremities.  Lab Results:  Data Reviewed: I have personally reviewed following labs and imaging studies  CBC:  Recent Labs Lab 03/09/16 0701 03/09/16 0708 03/10/16 [id number] 03/11/16 [id number] 03/12/16 0404 03/13/16 0543  WBC 18.4*  --  15.3* 13.1* 10.9* 11.7*  NEUTROABS 9.2*  --   --   --   --   --   HGB 10.5* 11.2* 9.4* 8.8* 8.0* 8.9*  HCT 31.2* 33.0* 27.1* 25.2* 22.9* 25.6*  MCV 77.0*  --  76.1* 75.9* 75.6* 75.7*  PLT 474*  --  393 348 320 [id number]    Basic Metabolic Panel:  Recent Labs Lab 03/09/16 0701 03/09/16 0708 03/10/16 0336 03/11/16 0318 03/12/16 0404 03/13/16 0543  NA 140 142 141 140 138 137  K 3.5 3.8 3.1* 3.7 3.4* 3.4*  CL 105 106 110 112* 109 103   CO2 22  --  22 20* 21* 22  GLUCOSE 235* 228* 158* 78 91 142*  BUN 44* 55* 52* 54* 60* 64*  CREATININE 2.37* 2.30* 2.78* 2.95* 3.41* 3.42*  CALCIUM 9.2  --  8.7* 8.8* 8.5* 8.9    GFR: Estimated Creatinine Clearance: 10.5 mL/min (by C-G formula based on SCr of 3.42 mg/dL (H)).  Liver Function Tests:  Recent Labs Lab 03/09/16 0701 03/10/16 0336  AST 56* 50*  ALT 23 25  ALKPHOS 203* 157*  BILITOT 0.5 0.5  PROT 8.8* 7.0  ALBUMIN 3.3* 2.7*    Cardiac Enzymes:  Recent Labs Lab 03/09/16 1126 03/09/16 1721 03/09/16 2256  TROPONINI 0.24* 0.40* 0.47*    CBG:  Recent Labs Lab 03/11/16 1546 03/11/16 2203 03/12/16 0813 03/12/16 1705 03/12/16 2114  GLUCAP 99 165* 98 145* 150*     Recent Results (from the past 240 hour(s))  Urine culture     Status: None   Collection Time: 03/09/16  7:46 AM  Result Value Ref Range Status   Specimen Description URINE, RANDOM  Final   Special Requests NONE  Final   Culture NO GROWTH Performed at [facility]   Final   Report Status 03/10/2016 FINAL  Final  Blood Culture (routine x 2)     Status: None (Preliminary result)   Collection Time: 03/09/16 10:26 AM  Result Value Ref Range Status   Specimen Description BLOOD LEFT HAND  Final   Special Requests IN PEDIATRIC BOTTLE 3CC  Final   Culture   Final    NO GROWTH 3 DAYS Performed at [facility]    Report Status PENDING  Incomplete  Blood Culture (routine x 2)     Status: None (Preliminary result)   Collection Time: 03/09/16 10:26 AM  Result Value Ref Range Status   Specimen Description BLOOD LEFT HAND  Final   Special Requests IN PEDIATRIC BOTTLE 3CC  Final   Culture   Final    NO GROWTH 3 DAYS Performed at [facility]    Report Status PENDING  Incomplete  MRSA PCR Screening     Status: None   Collection Time: 03/09/16 11:43 AM  Result Value Ref Range Status   MRSA by PCR NEGATIVE NEGATIVE Final    Comment:        The GeneXpert MRSA Assay  (FDA approved for NASAL specimens only), is one component of a comprehensive MRSA colonization surveillance program. It is not intended to diagnose MRSA infection nor to guide or monitor treatment for MRSA infections.       Radiology Studies: Dg Chest 2 View  Result Date: 03/12/2016 CLINICAL DATA:  Dyspnea. History of a cancer with pulmonary metastatic disease. EXAM: CHEST  2 VIEW COMPARISON:  Chest radiograph 03/10/2016 FINDINGS: Dual lead cardiac pacemaker is stable. Cardiomediastinal silhouette is normal. Mediastinal contours appear  intact. Calcific atherosclerotic disease of the aorta. There is mild improvement of the aeration of the lungs with persistent interstitial and alveolar airspace opacities. There is right greater than left pleural effusion. Osseous structures are without acute abnormality. Soft tissues are grossly normal. IMPRESSION: Mild improvement in the appearance of mixed pattern pulmonary edema with bilateral pleural effusions. Interstitial or parenchymal metastatic lesions will be difficult to visualize radiographically on the background of edema. Electronically Signed   By: Fidela Salisbury M.D.   On: 03/12/2016 20:12   US Renal  Result Date: 03/12/2016 CLINICAL DATA:  75 year old female with a history of acute renal failure EXAM: RENAL / URINARY TRACT ULTRASOUND COMPLETE COMPARISON:  None. FINDINGS: Right Kidney: Length: 9.9 cm. No hydronephrosis. Echogenicity of the right kidney greater than that of the adjacent imaged liver. Flow confirmed in the hilum of the right kidney. Left Kidney: Length: 9.9 cm. Echogenicity of the cortex the left kidney symmetric to the right, slightly increased. Questionable mild hydronephrosis or extrarenal pelvis. Two separate rounded lesions identified on the lateral cortex of the left kidney. Smaller measures 6 mm x 6 mm by 7 mm. Anechoic with no internal debris. Posterior acoustic enhancement and no internal flow. The larger on the lateral  cortex measures 1.0 cm x 0.9 cm x 1.1 cm. Anechoic with posterior acoustic enhancement. No internal debris identified. No flow on duplex images. Bladder: Appears normal for degree of bladder distention. IMPRESSION: Increased echogenicity of the bilateral renal parenchyma, suggesting medical renal disease. No evidence of right-sided hydronephrosis. There is questionable left-sided mild hydronephrosis or potentially extrarenal pelvis. If a more sensitive evaluation is warranted, consider CT. 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 Signed, Dulcy Fanny. Earleen Newport, DO [facility] Electronically Signed   By: Corrie Mckusick D.O.   On: 03/12/2016 11:52     Medications:  Scheduled: . aspirin EC  81 mg Oral Daily  . chlorhexidine  15 mL Mouth Rinse BID  . enoxaparin (LOVENOX) injection  30 mg Subcutaneous Q24H  . furosemide  80 mg Intravenous Q8H  . hydrALAZINE  25 mg Oral TID  . insulin aspart  0-9 Units Subcutaneous TID WC  . insulin glargine  10 Units Subcutaneous Q2200  . isosorbide mononitrate  30 mg Oral Daily  . mouth rinse  15 mL Mouth Rinse q12n4p  . metoprolol succinate  25 mg Oral Daily  . NIFEdipine  30 mg Oral Daily  . pantoprazole  40 mg Oral Daily   Continuous: . sodium chloride Stopped (03/10/16 1600)   [id number], hydrALAZINE, LORazepam, ondansetron **OR** ondansetron (ZOFRAN) IV, oxyCODONE  Assessment/Plan:  Active Problems:   CKD (chronic kidney disease)   Malignant neoplasm of liver (HCC)   Type 2 diabetes mellitus without complication, with long-term current use of insulin (HCC)   Essential hypertension   Acute on chronic congestive heart failure (HCC)   Acute respiratory failure (HCC)   ARF (acute renal failure) (HCC)    Acute hypoxic respiratory failure Likely multifactorial including congestive heart failure and possible pneumonia. Patient was also noted to be febrile.  Influenza PCR was negative. Patient has been doing well off of BiPAP. Cultures are all negative so far. Vancomycin was discontinued on 10/6. Discontinue cefepime today. Start oral Levaquin. She continues to be on diuretics.  Acute systolic congestive heart failure. Echocardiogram did show systolic dysfunction. Her records from [place] were reviewed. She had an echo back in July, which actually showed EF of 50%  at that time. So, she seems to have developed a new systolic dysfunction since then. Cardiology was consulted and they are following. Patient was taken off of intravenous Lasix and placed on oral torsemide. However, now she is back on intravenous Lasix again. Unfortunately, she has had episodes of incontinence since Foley was removed yesterday. Since there is a need to monitor her ins and outs closely and accurately, foley will be placed again. She is also off of her nitroglycerin infusion. Fluid restriction. Patient educated on dietary sodium intake and detrimental effect of high salt diet.  Acute on Chronic kidney disease likely stage III with hypokalemia Baseline creatinine appears to be around 2.1-2.4 based on records from [place]. Patient was being aggressively diuresed. Creatinine climbed to 3.4. Nephrology was consulted. They agree with aggressively diuresing her for now. Appreciate their input. Monitor urine output. Renal ultrasound report reviewed. Questionable left-sided hydronephrosis, but thought to be mild. Continue to monitor. Defer management of hypokalemia to nephrologists at this time. Apparently has been seen by nephrology in [place]. Dialysis was discussed and patient does not want to go on dialysis.   Mildly elevated troponin. Likely due to congestive heart failure and chronic kidney disease. Cardiology is following. See echocardiogram report as above.  Questionable healthcare associated pneumonia. Patient has had fever. Cultures are all negative so far. Influenza PCR was  negative. Repeat chest x-ray showed persistent bilateral infiltrate suggesting pulmonary edema. She could've had a viral infection. Change to oral Levaquin today.  Metastatic liver cancer. Patient has a history of metastatic hepatocellular carcinoma. She is status post ablation. Currently not receiving any treatment. There is also evidence that she has some kind of an intraventricular septal lesion which is thought to be secondary to her metastatic liver cancer. This was based on a cardiac MRI that was done in [place].  Diabetes mellitus type 2. Continue with insulin, sliding scale coverage. Monitor CBGs. HbA1c is 6.8.  Essential hypertension. Patient's blood pressure were quite high initially. She was placed on nitroglycerin infusion. She is now back on oral agents. Nitroglycerin was weaned off. Blood pressure medications being adjusted.   Microcytic anemia. Baseline hemoglobin is not known. No overt bleeding noted. Continue to monitor hemoglobin. Anemia panel is reviewed.  DVT Prophylaxis: Lovenox    Code Status: Patient has changed her CODE STATUS to full code. She is not interested in talking to palliative medicine team at this time.  Family Communication: Discussed with patient and her daughter  Disposition Plan: Patient continues to improve. Continues to require IV Lasix. Mobilize. PT and OT consulted.     LOS: 4 days   [facility] Pager [phone] 03/13/2016, 7:55 AM  If 7PM-7AM, please contact night-coverage at [email or web address], password [facility]

## 2016-03-13 NOTE — Progress Notes (Signed)
Pharmacy Antibiotic Note  Anna Gray is a 75 y.o. female admitted on 03/09/2016 with pneumonia.  Patient initially was on vancomycin and cefepime now pharmacy has been consulted for po levaquin dosing.  Plan: levaquin 750mg  po x 1 then 500mg  q48h Follow renal function  Height: 5\' 2"  (157.5 cm) Weight: 102 lb 14.4 oz (46.7 kg) IBW/kg (Calculated) : 50.1  Temp (24hrs), Avg:99 F (37.2 C), Min:98.4 F (36.9 C), Max:99.8 F (37.7 C)   Recent Labs Lab 03/09/16 0701 03/09/16 0708 03/09/16 0709 03/10/16 0336 03/11/16 0318 03/12/16 0404 03/13/16 0543  WBC 18.4*  --   --  15.3* 13.1* 10.9* 11.7*  CREATININE 2.37* 2.30*  --  2.78* 2.95* 3.41* 3.42*  LATICACIDVEN  --   --  3.78*  --   --   --   --     Estimated Creatinine Clearance: 10.5 mL/min (by C-G formula based on SCr of 3.42 mg/dL (H)).    Allergies  Allergen Reactions  . Vicodin [Hydrocodone-Acetaminophen] Nausea And Vomiting    Antimicrobials this admission: 10/4 Vancomycin >> 10/7 10/4 Cefepime >> 10/7 10/7 levaquin >>  Dose adjustments this admission:   Microbiology results: 10/4 BCx: NGTD 10/4 UCx: NG 10/4 MRSA PCR: neg 10/5 flu PCR: neg  Thank you for allowing pharmacy to be a part of this patient's care.  Dolly Rias RPh 03/13/2016, 8:09 AM Pager [phone]

## 2016-03-14 DIAGNOSIS — C787 Secondary malignant neoplasm of liver and intrahepatic bile duct: Secondary | ICD-10-CM

## 2016-03-14 DIAGNOSIS — I5021 Acute systolic (congestive) heart failure: Secondary | ICD-10-CM

## 2016-03-14 DIAGNOSIS — R748 Abnormal levels of other serum enzymes: Secondary | ICD-10-CM

## 2016-03-14 LAB — BASIC METABOLIC PANEL
Anion gap: 12 (ref 5–15)
BUN: 65 mg/dL — ABNORMAL HIGH (ref 6–20)
CALCIUM: 9.1 mg/dL (ref 8.9–10.3)
CO2: 26 mmol/L (ref 22–32)
CREATININE: 3.23 mg/dL — AB (ref 0.44–1.00)
Chloride: 101 mmol/L (ref 101–111)
GFR calc Af Amer: 15 mL/min — ABNORMAL LOW (ref >60)
GFR calc non Af Amer: 13 mL/min — ABNORMAL LOW (ref >60)
GLUCOSE: 99 mg/dL (ref 65–99)
Potassium: 3.4 mmol/L — ABNORMAL LOW (ref 3.5–5.1)
Sodium: 139 mmol/L (ref 135–145)

## 2016-03-14 LAB — CBC
HEMATOCRIT: 26.4 % — AB (ref 36.0–46.0)
Hemoglobin: 9.2 g/dL — ABNORMAL LOW (ref 12.0–15.0)
MCH: 26.4 pg (ref 26.0–34.0)
MCHC: 34.8 g/dL (ref 30.0–36.0)
MCV: 75.6 fL — AB (ref 78.0–100.0)
Platelets: 378 10*3/uL (ref 150–400)
RBC: 3.49 MIL/uL — ABNORMAL LOW (ref 3.87–5.11)
RDW: 16.8 % — AB (ref 11.5–15.5)
WBC: 12.8 10*3/uL — ABNORMAL HIGH (ref 4.0–10.5)

## 2016-03-14 LAB — CULTURE, BLOOD (ROUTINE X 2)
CULTURE: NO GROWTH
Culture: NO GROWTH

## 2016-03-14 LAB — GLUCOSE, CAPILLARY
GLUCOSE-CAPILLARY: 100 mg/dL — AB (ref 65–99)
GLUCOSE-CAPILLARY: 156 mg/dL — AB (ref 65–99)

## 2016-03-14 MED ORDER — ACETAMINOPHEN 325 MG PO TABS: 650.0000 mg | ORAL_TABLET | Freq: Four times a day (QID) | ORAL | 0 refills | Status: DC | PRN

## 2016-03-14 MED ORDER — POTASSIUM CHLORIDE CRYS ER 20 MEQ PO TBCR
40.0000 meq | EXTENDED_RELEASE_TABLET | Freq: Once | ORAL | Status: AC
  Administered 2016-03-14: 40 meq via ORAL
  Filled 2016-03-14: qty 2

## 2016-03-14 MED ORDER — FUROSEMIDE 80 MG PO TABS: 80.0000 mg | ORAL_TABLET | Freq: Two times a day (BID) | ORAL | 0 refills | Status: DC

## 2016-03-14 MED ORDER — HYDRALAZINE HCL 25 MG PO TABS
25.0000 mg | ORAL_TABLET | Freq: Three times a day (TID) | ORAL | 0 refills | Status: DC
Start: 2016-03-14 — End: 2016-04-22

## 2016-03-14 MED ORDER — LEVOFLOXACIN 500 MG PO TABS: 500.0000 mg | ORAL_TABLET | ORAL | 0 refills | Status: DC

## 2016-03-14 MED ORDER — POTASSIUM CHLORIDE CRYS ER 20 MEQ PO TBCR: 40.0000 meq | EXTENDED_RELEASE_TABLET | Freq: Every day | ORAL | 0 refills | Status: DC

## 2016-03-14 NOTE — Discharge Summary (Signed)
Physician Discharge Summary  Anna Gray [id number] DOB: [birth year] DOA: 03/09/2016  PCP: Junie Panning, DO  Admit date: 03/09/2016 Discharge date: 03/14/2016  Recommendations for Outpatient Follow-up:  Continue lasix as prescribed along with potassium supplementation Continue Levaquin for 4 doses on discharge, take 1 tablet every 48 hours  Discharge Diagnoses:  Active Problems:   CKD (chronic kidney disease)   Malignant neoplasm of liver (HCC)   Type 2 diabetes mellitus without complication, with long-term current use of insulin (HCC)   Essential hypertension   Acute on chronic congestive heart failure (HCC)   Acute respiratory failure (HCC)   ARF (acute renal failure) ([place])    Discharge Condition: stable   Diet recommendation: as tolerated   History of present illness:   Per brief narrative 03/13/2016 "75 year old African-American female with a past medical history of for diabetes, hypertension, 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 She was found to have acute congestive heart failure, but also was febrile. She was hospitalized for further management. Echocardiogram shows EF to be 30-35%. Cardiology was consulted. She was placed on high-dose diuretics. She also had rise in her creatinine. Nephrology was also consulted."   Hospital Course:   Acute hypoxic respiratory failure - Likely multifactorial including congestive heart failure and possible pneumonia.  - Stable respiratory status  Acute systolic congestive heart failure - Stable respiratory status at this time  - 2-D echo on this admission with ejection fraction of 30%  - Continue Lasix 80 mg twice daily along with potassium supplementation - Continue metoprolol,  nifedipine, isosorbide mononitrate and hydralazine  Healthcare associated pneumonia -Patient will continue 4 more doses of Levaquin on discharge   Acute on Chronic kidney disease stage III with hypokalemia - Baseline creatinine appears to be around 2.1-2.4 based on records from [place]. Patient was being aggressively diuresed. Creatinine climbed to 3.4.  - Nephrology was consulted. They agree with aggressively diuresing  - Per nephrology, continue Lasix by mouth, 80 mg twice daily and they will schedule outpatient follow-up in their sent  Mildly elevated troponin. - Likely demand ischemia from congestive heart failure and chronic kidney disease  - 2-D echo with ejection fraction of 30%  - Continue daily aspirin  - No reports of chest pain   Metastatic liver cancer - Patient has a history of metastatic hepatocellular carcinoma.  - She is status post ablation.  - Currently not receiving any treatment.  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 This was seen on cardiac MRI done in [place].  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 - Continue insulin regimen per home dose  Essential hypertension. - Continue Lasix, hydralazine, isosorbide mononitrate, metoprolol, nifedipine  Microcytic anemia. - Hemoglobin stable at 9.2   DVT Prophylaxis: Lovenox subQ in hospital  Code Status: full code  Family Communication: Discussed with patient; no family at the bedside this am     Consults: Cardiology, Nephrology  Procedures 2-D echo - ejection fraction 30% with akinesis of the anterior and apical myocardium  Antibiotics Vancomycin stopped 03/11/2016 Cefepime stopped 03/13/2016 Levaquin 03/13/2016 every other day for 4 more doses on discharge   Signed:  Leisa Lenz, MD  [facility] 03/14/2016,  11:55 AM  Pager #: [phone]  Time spent in minutes: less than 30 minutes    Discharge Exam: Vitals:   03/14/16 0538 03/14/16 0700  BP: (!) 143/74   Pulse: 88   Resp: 18   Temp: 100 F (37.8 C) 99.1 F (37.3 C)   Vitals:   03/13/16 1844 03/13/16 2226 03/14/16 0538 03/14/16 0700  BP: (!) 142/60 (!) 149/75 (!) 143/74   Pulse:  88 88   Resp:  18 18   Temp:  98.9 F (37.2 C) 100 F (37.8 C) 99.1 F (37.3 C)  TempSrc:  Oral Oral Oral  SpO2:  98% 97%   Weight:   45.1 kg (99 lb 8 oz)   Height:        General: Pt is alert, follows commands appropriately, not in acute distress Cardiovascular: Regular rate and rhythm, S1/S2 + Respiratory: Clear to auscultation bilaterally, no wheezing, no crackles, no rhonchi Abdominal: Soft, non tender, non distended, bowel sounds +, no guarding Extremities: no cyanosis, pulses palpable bilaterally DP and PT Neuro: Grossly nonfocal  Discharge Instructions  Discharge Instructions    Call MD for:  difficulty breathing, headache or visual disturbances    Complete by:  As directed    Call MD for:  redness, tenderness, or signs of infection (pain, swelling, redness, odor or green/yellow discharge around incision site)    Complete by:  As directed    Call MD for:  severe uncontrolled pain    Complete by:  As directed    Diet - low sodium heart healthy    Complete by:  As directed    Discharge instructions    Complete by:  As directed    Continue lasix as prescribed along with potassium supplementation Continue Levaquin for 4 doses on discharge, take 1 tablet every 48 hours   Increase activity slowly    Complete by:  As directed        Medication List    STOP taking these medications   cloNIDine 0.2 MG tablet Commonly known as:  CATAPRES   torsemide 20 MG tablet Commonly known as:  DEMADEX     TAKE these medications   acetaminophen 325 MG tablet Commonly known as:  TYLENOL Take 2 tablets (650 mg total) by mouth every 6  (six) hours as needed for mild pain or fever.   albuterol (2.5 MG/3ML) 0.083% nebulizer solution Commonly known as:  PROVENTIL Take 2.5 mg by nebulization every 2 (two) hours as needed for wheezing or shortness of breath.   aspirin EC 81 MG tablet Take 1 tablet (81 mg total) by mouth daily.   furosemide 80 MG tablet Commonly known as:  LASIX Take 1 tablet (80 mg total) by mouth 2 (two) times daily.   hydrALAZINE 25 MG tablet Commonly known as:  APRESOLINE Take 1 tablet (25 mg total) by mouth 3 (three) times daily. What changed:  medication strength  how much to take   HYDROmorphone 2 MG tablet Commonly known as:  DILAUDID Take 2-4 mg by mouth every 12 (twelve) hours as needed for severe pain.   Insulin Glargine 100 UNIT/ML Solostar Pen Commonly known as:  LANTUS Inject 10 Units into the skin daily at 10 pm.   isosorbide mononitrate 30 MG 24 hr tablet Commonly known as:  IMDUR Take 1 tablet (30 mg total) by mouth daily.   levofloxacin 500 MG tablet Commonly known as:  LEVAQUIN Take 1 tablet (500 mg total) by mouth every other  day. Start taking on:  03/15/2016   LORazepam 0.5 MG tablet Commonly known as:  ATIVAN Take 0.5 mg by mouth every 8 (eight) hours as needed for anxiety.   metoprolol succinate 100 MG 24 hr tablet Commonly known as:  TOPROL-XL Take 1 tablet (100 mg total) by mouth daily. Take with or immediately following a meal.   multivitamin with minerals Tabs tablet Take 1 tablet by mouth daily.   NIFEdipine 90 MG 24 hr tablet Commonly known as:  PROCARDIA XL/ADALAT-CC Take 1 tablet (90 mg total) by mouth daily.   ondansetron 4 MG disintegrating tablet Commonly known as:  ZOFRAN-ODT Take 4 mg by mouth every 8 (eight) hours as needed for nausea or vomiting.   pantoprazole 40 MG tablet Commonly known as:  PROTONIX Take 1 tablet (40 mg total) by mouth daily.   potassium chloride SA 20 MEQ tablet Commonly known as:  K-DUR,KLOR-CON Take 2 tablets (40  mEq total) by mouth daily.   simethicone 80 MG chewable tablet Commonly known as:  MYLICON Chew 80 mg by mouth 4 (four) times daily as needed for flatulence.      Follow-up [place], [place]. Schedule an appointment as soon as possible for a visit in 1 week(s).   Specialty:  Family Medicine Contact information: [id number] [place] [address] [phone]            The results of significant diagnostics from this hospitalization (including imaging, microbiology, ancillary and laboratory) are listed below for reference.    Significant Diagnostic Studies: Dg Chest 2 View  Result Date: 03/12/2016 CLINICAL DATA:  Dyspnea. History of a cancer with pulmonary metastatic disease. EXAM: CHEST  2 VIEW COMPARISON:  Chest radiograph 03/10/2016 FINDINGS: Dual lead cardiac pacemaker is stable. Cardiomediastinal silhouette is normal. Mediastinal contours appear intact. Calcific atherosclerotic disease of the aorta. There is mild improvement of the aeration of the lungs with persistent interstitial and alveolar airspace opacities. There is right greater than left pleural effusion. Osseous structures are without acute abnormality. Soft tissues are grossly normal. IMPRESSION: Mild improvement in the appearance of mixed pattern pulmonary edema with bilateral pleural effusions. Interstitial or parenchymal metastatic lesions will be difficult to visualize radiographically on the background of edema. Electronically Signed   By: Fidela Salisbury M.D.   On: 03/12/2016 20:12   US Renal  Result Date: 03/12/2016 CLINICAL DATA:  75 year old female with a history of acute renal failure EXAM: RENAL / URINARY TRACT ULTRASOUND COMPLETE COMPARISON:  None. FINDINGS: Right Kidney: Length: 9.9 cm. No hydronephrosis. Echogenicity of the right kidney greater than that of the adjacent imaged liver. Flow confirmed in the hilum of the right kidney. Left Kidney: Length: 9.9 cm. Echogenicity of  the cortex the left kidney symmetric to the right, slightly increased. Questionable mild hydronephrosis or extrarenal pelvis. Two separate rounded lesions identified on the lateral cortex of the left kidney. Smaller measures 6 mm x 6 mm by 7 mm. Anechoic with no internal debris. Posterior acoustic enhancement and no internal flow. The larger on the lateral cortex measures 1.0 cm x 0.9 cm x 1.1 cm. Anechoic with posterior acoustic enhancement. No internal debris identified. No flow on duplex images. Bladder: Appears normal for degree of bladder distention. IMPRESSION: Increased echogenicity of the bilateral renal parenchyma, suggesting medical renal disease. No evidence of right-sided hydronephrosis. There is questionable left-sided mild hydronephrosis or potentially extrarenal pelvis. If a more sensitive evaluation is warranted, consider CT. 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 Signed, Dulcy Fanny. Earleen Newport, DO [facility] Radiology Electronically Signed   By: Corrie Mckusick D.O.   On: 03/12/2016 11:52   Dg Chest Port 1 View  Result Date: 03/10/2016 CLINICAL DATA:  Shortness of breath, dyspnea, difficulty breathing, remote history of lung cancer EXAM: PORTABLE CHEST 1 VIEW COMPARISON:  03/09/2016 FINDINGS: Diffuse asymmetric interstitial opacities throughout the lungs compatible with persistent edema. Small pleural effusions noted. There is associated bibasilar atelectasis. Stable mild cardiac enlargement. No pneumothorax. Left subclavian pacemaker noted. Trachea is midline. Atherosclerosis of the aorta. IMPRESSION: Persistent interstitial edema pattern and pleural effusions compatible with CHF. Electronically Signed   By: Jerilynn Mages.  Shick M.D.   On: 03/10/2016 08:44   Dg Chest Port 1 View  Result Date: 03/09/2016 CLINICAL DATA:  Shortness of breath, difficulty breathing with increased symptoms since 5 a.m. today. History of lung  malignancy, CHF, diabetes, chronic renal insufficiency. EXAM: PORTABLE CHEST 1 VIEW COMPARISON:  None in PACs FINDINGS: The lungs are hyperinflated. The interstitial markings are increased diffusely with areas of near confluence in the left mid lung. The heart is top-normal in size. The pulmonary vascularity is mildly prominent centrally. There is calcification in the wall of the aortic arch. The permanent pacemaker is in reasonable position radiographically. IMPRESSION: COPD. Interstitial edema or infiltrate compatible with CHF and/or pneumonia. Aortic atherosclerosis. Electronically Signed   By: David  Martinique M.D.   On: 03/09/2016 07:14    Microbiology: Recent Results (from the past 240 hour(s))  Urine culture     Status: None   Collection Time: 03/09/16  7:46 AM  Result Value Ref Range Status   Specimen Description URINE, RANDOM  Final   Special Requests NONE  Final   Culture NO GROWTH Performed at [facility]   Final   Report Status 03/10/2016 FINAL  Final  Blood Culture (routine x 2)     Status: None   Collection Time: 03/09/16 10:26 AM  Result Value Ref Range Status   Specimen Description BLOOD LEFT HAND  Final   Special Requests IN PEDIATRIC BOTTLE 3CC  Final   Culture   Final    NO GROWTH 5 DAYS Performed at [facility]    Report Status 03/14/2016 FINAL  Final  Blood Culture (routine x 2)     Status: None   Collection Time: 03/09/16 10:26 AM  Result Value Ref Range Status   Specimen Description BLOOD LEFT HAND  Final   Special Requests IN PEDIATRIC BOTTLE 3CC  Final   Culture   Final    NO GROWTH 5 DAYS Performed at [facility]    Report Status 03/14/2016 FINAL  Final  MRSA PCR Screening     Status: None   Collection Time: 03/09/16 11:43 AM  Result Value Ref Range Status   MRSA by PCR NEGATIVE NEGATIVE Final     Labs: Basic Metabolic Panel:  Recent Labs Lab 03/10/16 0336 03/11/16 0318 03/12/16 0404 03/13/16 0543 03/14/16 0444  NA 141  140 138 137 139  K 3.1* 3.7 3.4* 3.4* 3.4*  CL 110 112* 109 103 101  CO2 22 20* 21* 22 26  GLUCOSE 158* 78 91 142* 99  BUN 52* 54* 60* 64* 65*  CREATININE 2.78* 2.95* 3.41* 3.42* 3.23*  CALCIUM 8.7* 8.8* 8.5* 8.9 9.1   Liver Function Tests:  Recent Labs Lab 03/09/16 0701 03/10/16 0336  AST 56* 50*  ALT 23 25  ALKPHOS 203* 157*  BILITOT 0.5 0.5  PROT 8.8* 7.0  ALBUMIN 3.3* 2.7*   No results for input(s): LIPASE, AMYLASE in the last 168 hours. No results for input(s): AMMONIA in the last 168 hours. CBC:  Recent Labs Lab 03/09/16 0701  03/10/16 0336 03/11/16 0318 03/12/16 0404 03/13/16 0543 03/14/16 0444  WBC 18.4*  --  15.3* 13.1* 10.9* 11.7* 12.8*  NEUTROABS 9.2*  --   --   --   --   --   --   HGB 10.5*  < > 9.4* 8.8* 8.0* 8.9* 9.2*  HCT 31.2*  < > 27.1* 25.2* 22.9* 25.6* 26.4*  MCV 77.0*  --  76.1* 75.9* 75.6* 75.7* 75.6*  PLT 474*  --  393 348 320 364 378  < > = values in this interval not displayed. Cardiac Enzymes:  Recent Labs Lab 03/09/16 1126 03/09/16 1721 03/09/16 2256  TROPONINI 0.24* 0.40* 0.47*   BNP: BNP (last 3 results)  Recent Labs  03/09/16 0701  BNP 1,185.9*    ProBNP (last 3 results) No results for input(s): PROBNP in the last 8760 hours.  CBG:  Recent Labs Lab 03/13/16 0810 03/13/16 1144 03/13/16 1721 03/13/16 2119 03/14/16 0725  GLUCAP 194* 127* 134* 137* 100*

## 2016-03-14 NOTE — Discharge Instructions (Signed)
Acute Kidney Injury °Acute kidney injury is any condition in which there is sudden (acute) damage to the kidneys. Acute kidney injury was previously known as acute kidney failure or acute renal failure. The kidneys are two organs that lie on either side of the spine between the middle of the back and the front of the abdomen. The kidneys: °· Remove wastes and extra water from the blood.   °· Produce important hormones. These help keep bones strong, regulate blood pressure, and help create red blood cells.   °· Balance the fluids and chemicals in the blood and tissues. °A small amount of kidney damage may not cause problems, but a large amount of damage may make it difficult or impossible for the kidneys to work the way they should. Acute kidney injury may develop into long-lasting (chronic) kidney disease. It may also develop into a life-threatening disease called end-stage kidney disease. Acute kidney injury can get worse very quickly, so it should be treated right away. Early treatment may prevent other kidney diseases from developing. °CAUSES  °· A problem with blood flow to the kidneys. This may be caused by:   °¨ Blood loss.   °¨ Heart disease.   °¨ Severe burns.   °¨ Liver disease. °· Direct damage to the kidneys. This may be caused by: °¨ Some medicines.   °¨ A kidney infection.   °¨ Poisoning or consuming toxic substances.   °¨ A surgical wound.   °¨ A blow to the kidney area.   °· A problem with urine flow. This may be caused by:   °¨ Cancer.   °¨ Kidney stones.   °¨ An enlarged prostate. °SIGNS AND SYMPTOMS  °· Swelling (edema) of the legs, ankles, or feet.   °· Tiredness (lethargy).   °· Nausea or vomiting.   °· Confusion.   °· Problems with urination, such as:   °¨ Painful or burning feeling during urination.   °¨ Decreased urine production.   °¨ Frequent accidents in children who are potty trained.   °¨ Bloody urine.   °· Muscle twitches and cramps.   °· Shortness of breath.   °· Seizures.   °· Chest  pain or pressure. °Sometimes, no symptoms are present.  °DIAGNOSIS °Acute kidney injury may be detected and diagnosed by tests, including blood, urine, imaging, or kidney biopsy tests.  °TREATMENT °Treatment of acute kidney injury varies depending on the cause and severity of the kidney damage. In mild cases, no treatment may be needed. The kidneys may heal on their own. If acute kidney injury is more severe, your health care provider will treat the cause of the kidney damage, help the kidneys heal, and prevent complications from occurring. Severe cases may require a procedure to remove toxic wastes from the body (dialysis) or surgery to repair kidney damage. Surgery may involve:  °· Repair of a torn kidney.   °· Removal of an obstruction. °HOME CARE INSTRUCTIONS °· Follow your prescribed diet. °· Take medicines only as directed by your health care provider.  °· Do not take any new medicines (prescription, over-the-counter, or nutritional supplements) unless approved by your health care provider. Many medicines can worsen your kidney damage or may need to have the dose adjusted.   °· Keep all follow-up visits as directed by your health care provider. This is important. °· Observe your condition to make sure you are healing as expected. °SEEK IMMEDIATE MEDICAL CARE IF: °· You are feeling ill or have severe pain in the back or side.   °· Your symptoms return or you have new symptoms. °· You have any symptoms of end-stage kidney disease. These include:   °¨ Persistent itchiness.   °¨   Loss of appetite.   °¨ Headaches.   °¨ Abnormally dark or light skin. °¨ Numbness in the hands or feet.   °¨ Easy bruising.   °¨ Frequent hiccups.   °¨ Menstruation stops.   °· You have a fever. °· You have increased urine production. °· You have pain or bleeding when urinating. °MAKE SURE YOU:  °· Understand these instructions. °· Will watch your condition. °· Will get help right away if you are not doing well or get worse. °  °This  information is not intended to replace advice given to you by your health care provider. Make sure you discuss any questions you have with your health care provider. °  °Document Released: 12/06/2010 Document Revised: 06/13/2014 Document Reviewed: 01/20/2012 °Elsevier Interactive Patient Education ©2016 [employer] ° °

## 2016-03-14 NOTE — Plan of Care (Signed)
Problem: Food- and Nutrition-Related Knowledge Deficit (NB-1.1) Goal: Nutrition education Formal process to instruct or train a patient/client in a skill or to impart knowledge to help patients/clients voluntarily manage or modify food choices and eating behavior to maintain or improve health. Outcome: Completed/Met Date Met: 03/14/16 Nutrition Education Note  RD consulted for Renal Education. Provided Early Kidney Disease Booklet to patient/family.   Explained why diet restrictions are needed and provided lists of foods to limit/avoid that are high sodium. Provided specific recommendations on safer alternatives of these foods. Only encouraged patient to limit sodium, fluid (1.2 L per MD), and count CHO in setting of Dm. Other restrictions unnecessary per labs.   Discussed importance of protein intake at each meal and snack. Provided examples of how to maximize protein intake throughout the day.   Teach back method used.  Expect good compliance.  Body mass index is 18.2 kg/m. Pt meets criteria for Underweight based on current BMI.  Current diet order is Renal/CHO Mod with 1.2 L fluid restriction, patient is consuming approximately 25-50% of meals at this time. Labs and medications reviewed. No further nutrition interventions warranted at this time. RD contact information provided. If additional nutrition issues arise, please re-consult RD.  Willey Blade, MS, RD, LDN Pager: [phone] After Hours Pager: [phone]

## 2016-03-14 NOTE — Progress Notes (Addendum)
Pt and pt's daughter selected [place] for [facility] needs.  Referral given to in house rep.

## 2016-03-14 NOTE — Evaluation (Signed)
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 She was found to have acute congestive heart failure, but also was febrile   Clinical Impression   Pt admitted with SOB. Pt currently with functional limitations due to the deficits listed below (see OT Problem List).  Pt will benefit from skilled OT to increase their safety and independence with ADL and functional mobility for ADL to facilitate discharge to venue listed below.      Follow Up Recommendations  Home health OT    Equipment Recommendations  None recommended by OT       Precautions / Restrictions Precautions Precautions: Fall Restrictions Weight Bearing Restrictions: No      Mobility Bed Mobility Overal bed mobility: Needs Assistance Bed Mobility: Supine to Sit     Supine to sit: Modified independent (Device/Increase time)        Transfers Overall transfer level: Needs assistance Equipment used: 1 person hand held assist Transfers: Sit to/from [facility] Sit to Stand: Min assist         General transfer comment: assist to steady upon rising, cues for overall safety         ADL Overall ADL's : Needs assistance/impaired Eating/Feeding: Set up;Sitting   Grooming: Set up;Sitting   Upper Body Bathing: Set up;Sitting   Lower Body Bathing: Minimal assistance;Sit to/from stand;Cueing for sequencing;Cueing for compensatory techniques;Cueing for safety   Upper Body Dressing : Set up;Sitting   Lower Body  Dressing: Minimal assistance;Cueing for safety;Cueing for sequencing   Toilet Transfer: Min guard;Ambulation;BSC   Toileting- Water quality scientist and Hygiene: Min guard;Sit to/from stand;Cueing for safety;Cueing for sequencing                         Pertinent Vitals/Pain Pain Assessment: No/denies pain           Communication Communication Communication: No difficulties   Cognition Arousal/Alertness: Awake/alert Behavior During Therapy: WFL for tasks assessed/performed Overall Cognitive Status: Within Functional Limits for tasks assessed                                Home Living Family/patient expects to be discharged to:: Private residence Living Arrangements: Children (dtr)   Type of Home: Apartment Home Access: Stairs to enter Technical brewer of Steps: flight Entrance Stairs-Rails: Right;Left Home Layout: One level     Bathroom Shower/Tub: Chief Strategy Officer: None          Prior Functioning/Environment Level of Independence: Independent                 OT Problem List: Decreased strength;Decreased activity tolerance   OT Treatment/Interventions: Self-care/ADL training;DME and/or AE instruction    OT Goals(Current goals can be found in the care plan section) Acute Rehab OT Goals Patient Stated Goal: home soon OT Goal Formulation: With patient Time For Goal Achievement: 03/28/16  OT Frequency: Min 2X/week              End of Session  Nurse Communication: Mobility status  Activity Tolerance: Patient tolerated treatment well Patient left: in chair;with call bell/phone within reach;with chair alarm set   Time: [id number] OT Time Calculation (min): 19 min Charges:  OT General Charges $OT Visit: 1 Procedure OT Evaluation $OT Eval Moderate Complexity: 1 Procedure G-Codes:    Payton Mccallum D Apr 04, 2016, 11:36 AM

## 2016-03-15 ENCOUNTER — Telehealth: Payer: Self-pay | Admitting: Family Medicine

## 2016-03-15 NOTE — Telephone Encounter (Signed)
Discharged from [place] [facility] yesterday. She needs refill on ativan.  Hospital said it needed to be prescribed by PCP.  She is also very depressed.  What can be done? She also needs in home oxygen. She is waiting for [facility] to return her call.  Pt has no oxygen now. She had oxygen before she went into the hospital.  Please advise. Do any of her records show anything about the oxygen?

## 2016-03-16 ENCOUNTER — Telehealth: Payer: Self-pay | Admitting: Family Medicine

## 2016-03-16 NOTE — Progress Notes (Signed)
A call to the office from pt's daughter stating pt did not rec oxygen. Pt had no order for Home O2, did not qualify for Home O2, Sats room air greater than 88%. Pt's Room Air Sats 100%.

## 2016-03-16 NOTE — Telephone Encounter (Signed)
Anna Gray needs to do a medication review and wants to discuss refills. Please advise. Thanks! ep

## 2016-03-17 NOTE — Telephone Encounter (Signed)
Spoke to pt daughter and she stated that pt gets her O2 from [facility].  Also we checked to see if PCP had any openings before the 16th and she did not.  Daughter said she would just keep the 16th appointment and I told her that if she thought she needed to be seen earlier then we could try to get her in with another PCP.  She understood. Katharina Caper, Evea Sheek D, [place]

## 2016-03-17 NOTE — Telephone Encounter (Signed)
Left voice message for [name] to return nurse call regarding patient's medication.  Derl Barrow, RN

## 2016-03-17 NOTE — Telephone Encounter (Signed)
Anna Gray returns call, she needs to discuss some refills:  She needs to know if pt is supposed to be on these, because the D/C summary says so, but the pt does not have refills  1. Furosemide: 80mg  BID 2. Albuterol neb meds: and nebulizer if she is supposed to be using this 3. Hydromorphone 2mg  q12 PRN 4. Lantus: will need pen needles 5. Lorazepam: 0.5mg  q8 PRN 6. Zofran: 4mg  q4 PRN 7.Simethocone 80mg  q4 PRN  If pt is to be on these she will need refills   Also,  1. Pt states that she is supposed to be on O2 all the time, but all of her home tanks are empty.  [name] reports that pts O2 is 98% at rest.  If pt if supposed to be on O2 then she will need a Rx sent to [facility]  2. Anna Gray would also like a referral for Social Work for [employer]"   Please call [name] back about the above. Anna Gray, [name], CMA

## 2016-03-17 NOTE — Telephone Encounter (Signed)
Prescription for O2 placed in to be faxed area.

## 2016-03-17 NOTE — Telephone Encounter (Signed)
Will discuss depression at next office visit on 10/16. Can schedule appointment sooner if she would like to be seen before then.  Will need to know where she gets her oxygen before a prescription for O2 can be written. Was on 2L [place] at last office visit.

## 2016-03-18 ENCOUNTER — Encounter: Payer: Self-pay | Admitting: Licensed Clinical Social Worker

## 2016-03-18 MED ORDER — POTASSIUM CHLORIDE CRYS ER 20 MEQ PO TBCR: 40.0000 meq | EXTENDED_RELEASE_TABLET | Freq: Every day | ORAL | 3 refills | Status: DC

## 2016-03-18 MED ORDER — LORAZEPAM 0.5 MG PO TABS: 0.5000 mg | ORAL_TABLET | Freq: Three times a day (TID) | ORAL | 0 refills | Status: DC | PRN

## 2016-03-18 MED ORDER — HYDROMORPHONE HCL 2 MG PO TABS: 2.0000 mg | ORAL_TABLET | Freq: Three times a day (TID) | ORAL | 0 refills | Status: DC | PRN

## 2016-03-18 MED ORDER — FUROSEMIDE 80 MG PO TABS: 80.0000 mg | ORAL_TABLET | Freq: Two times a day (BID) | ORAL | 4 refills | Status: DC

## 2016-03-18 MED ORDER — NIFEDIPINE ER OSMOTIC RELEASE 90 MG PO TB24: 90.0000 mg | ORAL_TABLET | Freq: Every day | ORAL | 4 refills | Status: DC

## 2016-03-18 MED ORDER — ISOSORBIDE MONONITRATE ER 30 MG PO TB24: 30.0000 mg | ORAL_TABLET | Freq: Every day | ORAL | 4 refills | Status: DC

## 2016-03-18 NOTE — Telephone Encounter (Signed)
Contacted [name]. Will refill Furosemide at this time. Does not need refills of Lantus. Discussed that other medications are only as needed. Does not feel that she needs most of the medications, but states her mother is very anxious and in significant pain due to the cancer. Requests Ativan and pain medication to get to appointment next week. Discussed that both of these medications can increase her risk of falls and should only be taken as prescribed. States that she plans to only take one dose of each at night.  Will leave prescription for both at front desk for pick up.  Forwarding to Neoma Laming to get social work involved for [employer].

## 2016-03-18 NOTE — Telephone Encounter (Signed)
Contacted by [place]. States that there are multiple printed prescriptions that have not been filled. Noted to almost be out of Imidur, Procardia, and Potassium. Refills sent. Encouraged her to let family know that the printed prescriptions should be filled.

## 2016-03-18 NOTE — Progress Notes (Signed)
Patient ID: Anna Gray, female   DOB: 1941-04-02, 75 y.o.   MRN: [id number]  Received social work consult from Dr. Gerlean Ren to assist patient with community resources.  Request was from [place], Care manager with [place].  Call to [facility] [phone], to obtain additional information on request.  [name] request is for a Education officer, museum with [place] to do the "[employer]".   Plan: LCSW will notify Dr. Gerlean Ren of the request so that she can place an order for social work via a referral.  Casimer Lanius, LCSW Licensed Clinical Social Worker [place]   [phone] 4:12 PM

## 2016-03-21 ENCOUNTER — Encounter: Payer: Self-pay | Admitting: Family Medicine

## 2016-03-21 ENCOUNTER — Telehealth: Payer: Self-pay | Admitting: *Deleted

## 2016-03-21 ENCOUNTER — Ambulatory Visit (INDEPENDENT_AMBULATORY_CARE_PROVIDER_SITE_OTHER): Payer: Medicare Other | Admitting: Family Medicine

## 2016-03-21 VITALS — BP 136/48 | HR 65 | Temp 99.2°F | Ht 62.0 in | Wt 105.2 lb

## 2016-03-21 DIAGNOSIS — I5022 Chronic systolic (congestive) heart failure: Secondary | ICD-10-CM

## 2016-03-21 DIAGNOSIS — G250 Essential tremor: Secondary | ICD-10-CM

## 2016-03-21 DIAGNOSIS — N184 Chronic kidney disease, stage 4 (severe): Secondary | ICD-10-CM

## 2016-03-21 DIAGNOSIS — E119 Type 2 diabetes mellitus without complications: Secondary | ICD-10-CM | POA: Diagnosis not present

## 2016-03-21 DIAGNOSIS — C7800 Secondary malignant neoplasm of unspecified lung: Secondary | ICD-10-CM | POA: Diagnosis not present

## 2016-03-21 DIAGNOSIS — Z794 Long term (current) use of insulin: Secondary | ICD-10-CM | POA: Diagnosis not present

## 2016-03-21 LAB — CBC
HEMATOCRIT: 28.5 % — AB (ref 35.0–45.0)
HEMOGLOBIN: 9.6 g/dL — AB (ref 11.7–15.5)
MCH: 25.9 pg — ABNORMAL LOW (ref 27.0–33.0)
MCHC: 33.7 g/dL (ref 32.0–36.0)
MCV: 76.8 fL — ABNORMAL LOW (ref 80.0–100.0)
MPV: 9.8 fL (ref 7.5–12.5)
Platelets: 523 10*3/uL — ABNORMAL HIGH (ref 140–400)
RBC: 3.71 MIL/uL — AB (ref 3.80–5.10)
RDW: 16.4 % — AB (ref 11.0–15.0)
WBC: 12.5 10*3/uL — AB (ref 3.8–10.8)

## 2016-03-21 MED ORDER — PANTOPRAZOLE SODIUM 40 MG PO TBEC: 40.0000 mg | DELAYED_RELEASE_TABLET | Freq: Every day | ORAL | 4 refills | Status: AC

## 2016-03-21 MED ORDER — METOPROLOL SUCCINATE ER 100 MG PO TB24: 100.0000 mg | ORAL_TABLET | Freq: Every day | ORAL | 4 refills | Status: DC

## 2016-03-21 NOTE — Patient Instructions (Addendum)
Thank you so much for coming to visit today! Your medication list should be included in the after visit summary. Please make sure you are taking Aspirin, Protonix, and Metoprolol as these were not in your bag today! We will give you a Lantus Pen today.  I suspect your tremor is due to an Essential Tremor. Metoprolol should help with this--if it does not, please return and we will transition to another similar medication that should help! Your oxygen level was normal here. You do not qualify for oxygen at this time. We will keep a close eye on this. A referral to Nephrology was placed. You should hear from them to set up an appointment. Please contact your cardiologist about the pacemaker.  Dr. Gerlean Ren

## 2016-03-21 NOTE — Telephone Encounter (Signed)
Physical therapist calling to inform MD that PT eval is scheduled for 03/22/16. FYI to PCP.

## 2016-03-21 NOTE — Progress Notes (Signed)
Subjective:     Patient ID: Anna Gray, female   DOB: Sep 14, 1940, 75 y.o.   MRN: [id number]  HPI Anna Gray is a 75yo female presenting today for hospital follow up. - Hospitalized from 10/4 to 03/14/2016 for acute congestive heart failure and pneumonia. Transitioned from Torsemide to Lasix. Echocardiogram revealed EF of 30-35%, moderate LVH, systolic function moderately to severely reduced. Nephrology consulted during hospitalization for acute on chronic kidney disease; determined to not be a dialysis candidate. Clonidine discontinued during hospitalization. Was on Vancomycin fromm 10/4-10/6, Cefepime from 10/4-10/8, and transitioned to Levaquin 10/8. Discharged with four doses of Levaquin left to take every 48hr. Lasix 80mg  twice daily continued at discharge. To follow up with Cardiology and Nephrology. - Completed course of Levaquin. - Denies chest pain, shortness of breath, wheezing - Reports she is feeling much better since hospitalization. Still notes some weakness and fatigue. - Complains of nausea and poor appetite - Bilateral tremor reported, unimproved with intention. States she has had it for many years, but it has been getting worse with time. - Unsure if she has been taking her Metoprolol. States she thought this was stopped. Discharge summary with plan to continue medication reviewed. Does occasionally feel like her heart is racing. Noted to have atrial tachycardia without metoprolol in hospital. Follow up with Cardiology scheduled. - Reports she has not been taking her Lantus since she is out. Her insurance does not start back until November 1. Unable to afford without insurance - Nonsmoker  Review of Systems Per HPI    Objective:   Physical Exam  Constitutional: She is oriented to person, place, and time. She appears well-developed and well-nourished. No distress.  Cardiovascular: Normal rate and regular rhythm.   Murmur heard. Pulmonary/Chest: Effort normal. No respiratory  distress. She has no wheezes.  Musculoskeletal: She exhibits no edema.  Neurological: She is alert and oriented to person, place, and time. No cranial nerve deficit.  Bilateral hand tremor noted not improved with intention. Muscle strength 5/5 in upper and lower extremities. Sensation intact.  Psychiatric: She has a normal mood and affect. Her behavior is normal.      Assessment and Plan:     Chronic systolic congestive heart failure (HCC) - Improved from acute exacerbation. - Continue Lasix 80mg  twice daily - Will obtain BMP today - Follow up with Cardiology and Nephrology - Medications reviewed. Asked to make sure she is taking her Metoprolol when she returns home. Refill of Metoprolol sent to pharmacy.  Essential tremor - Given chronic nature with no improvement on intention - Encouraged to restart Metoprolol.  CKD (chronic kidney disease) - Hospital notes show that follow up with Nephrology would be arranged. Patient reports she has not been contacted. Referral to Nephrology placed. - Will check BMP  Malignant neoplasm metastatic to lung (HCC) - O2 testing with pulse oximetry 98% at rest and 95% with exertion. Does not qualify for home oxygen. Will monitor closely.  Type 2 diabetes mellitus without complication, with long-term current use of insulin (HCC) - Two Lantus pens given from office

## 2016-03-22 ENCOUNTER — Telehealth: Payer: Self-pay | Admitting: *Deleted

## 2016-03-22 LAB — BASIC METABOLIC PANEL WITH GFR
BUN: 56 mg/dL — ABNORMAL HIGH (ref 7–25)
CHLORIDE: 101 mmol/L (ref 98–110)
CO2: 22 mmol/L (ref 20–31)
Calcium: 9.8 mg/dL (ref 8.6–10.4)
Creat: 3.41 mg/dL — ABNORMAL HIGH (ref 0.60–0.93)
GFR, EST AFRICAN AMERICAN: 14 mL/min — AB (ref >=60)
GFR, EST NON AFRICAN AMERICAN: 13 mL/min — AB (ref >=60)
GLUCOSE: 202 mg/dL — AB (ref 65–99)
POTASSIUM: 5 mmol/L (ref 3.5–5.3)
Sodium: 138 mmol/L (ref 135–146)

## 2016-03-22 MED ORDER — METOCLOPRAMIDE HCL 5 MG/5ML PO SOLN: 2.5000 mg | Freq: Three times a day (TID) | ORAL | 0 refills | Status: AC

## 2016-03-22 NOTE — Addendum Note (Signed)
Addended by: Lorna Few on: 03/22/2016 05:35 PM   Modules accepted: Orders

## 2016-03-22 NOTE — Telephone Encounter (Signed)
Adonis Brook called back stating patient was only taking 25 mg of metoprolol and not the 100 mg that was prescribed at discharge.  Patient has made an appointment with her cardiologist but it is 2 weeks out.  Advised her that new Rx for metoprolol 100 mg was sent in to pharmacy yesterday.  Patient also does not have a ride to make it to [facility] today.  Patient is asking for a call from Dr. Gerlean Ren regarding the nausea.  Please advise.  Derl Barrow, RN

## 2016-03-22 NOTE — Telephone Encounter (Addendum)
Anna Gray, Physical Therapist with [facility] called to request verbal orders.  Physical therapy twice a week for 4 weeks and speech therapy evaluation for hoarseness.  She also reported that patient's heart rate is evaluated and patient has a pacemaker.  Patient's heart rate at rest 111.  Patient is also complaining of continuous nausea  with no relief from medications and decrease appetite. Verbal order given by Dr. Gerlean Ren for physical therapy and speech therapy.  Patient also contact her cardiologist regarding her heart rate. If patient can not get an appointment with cardiologist patient should be seen in at [facility] per Dr. Gerlean Ren. Patient should also make sure she is taking her beta blocker.  Anna Barrow, RN

## 2016-03-22 NOTE — Telephone Encounter (Signed)
Attempted to contact x2 without response. Prescription for Reglan sent to pharmacy. Given age and renal function, only 2.108mL four times daily prescribed. Recommend scheduling appointment to be seen by cardiology or our office over the next several days. To call 911 if shortness of breath or chest pain develops.

## 2016-03-23 DIAGNOSIS — G250 Essential tremor: Secondary | ICD-10-CM | POA: Insufficient documentation

## 2016-03-23 NOTE — Assessment & Plan Note (Signed)
-   Hospital notes show that follow up with Nephrology would be arranged. Patient reports she has not been contacted. Referral to Nephrology placed. - Will check BMP

## 2016-03-23 NOTE — Assessment & Plan Note (Addendum)
-   Improved from acute exacerbation. - Continue Lasix 80mg  twice daily - Will obtain BMP today - Follow up with Cardiology and Nephrology - Medications reviewed. Asked to make sure she is taking her Metoprolol when she returns home. Refill of Metoprolol sent to pharmacy. - Follow up following Cardiology appointment or sooner if needed

## 2016-03-23 NOTE — Assessment & Plan Note (Signed)
-   Given chronic nature with no improvement on intention - Encouraged to restart Metoprolol.

## 2016-03-23 NOTE — Assessment & Plan Note (Signed)
-   O2 testing with pulse oximetry 98% at rest and 95% with exertion. Does not qualify for home oxygen. Will monitor closely.

## 2016-03-23 NOTE — Assessment & Plan Note (Signed)
-   Two Lantus pens given from office

## 2016-03-24 NOTE — Telephone Encounter (Signed)
Patient has cardiology appointment on 04/01/16. Anna Gray,CMA

## 2016-03-27 ENCOUNTER — Other Ambulatory Visit: Payer: Self-pay | Admitting: Family Medicine

## 2016-03-27 DIAGNOSIS — C220 Liver cell carcinoma: Secondary | ICD-10-CM

## 2016-03-29 ENCOUNTER — Encounter: Payer: Self-pay | Admitting: Licensed Clinical Social Worker

## 2016-03-29 NOTE — Progress Notes (Signed)
Patient ID: Anna Gray, female   DOB: 1940-09-29, 75 y.o.   MRN: [id number]  LCSW contacted [place] to follow up on the Social Work order placed by Dr. Gerlean Ren.   Per Jiles Crocker with [facility], patient is scheduled to see the [facility] Social Worker today.  Casimer Lanius, LCSW Licensed Clinical Social Worker [place]   [phone] 10:47 AM

## 2016-03-30 ENCOUNTER — Telehealth: Payer: Self-pay | Admitting: Family Medicine

## 2016-03-30 NOTE — Telephone Encounter (Signed)
Altha Harm is calling to report she has not been able to get in touch with pt. No answer on the phone, no answer at the door. Pt will possibly be discharged. Please advise. Thanks! ep

## 2016-04-01 ENCOUNTER — Ambulatory Visit (INDEPENDENT_AMBULATORY_CARE_PROVIDER_SITE_OTHER): Payer: Medicare Other | Admitting: Internal Medicine

## 2016-04-01 ENCOUNTER — Encounter: Payer: Self-pay | Admitting: Internal Medicine

## 2016-04-01 ENCOUNTER — Ambulatory Visit: Payer: Medicare Other | Admitting: Internal Medicine

## 2016-04-01 VITALS — BP 130/76 | HR 75 | Ht 62.5 in | Wt 104.0 lb

## 2016-04-01 DIAGNOSIS — I5022 Chronic systolic (congestive) heart failure: Secondary | ICD-10-CM | POA: Diagnosis not present

## 2016-04-01 DIAGNOSIS — I471 Supraventricular tachycardia: Secondary | ICD-10-CM | POA: Diagnosis not present

## 2016-04-01 DIAGNOSIS — Z950 Presence of cardiac pacemaker: Secondary | ICD-10-CM | POA: Diagnosis not present

## 2016-04-01 DIAGNOSIS — C7800 Secondary malignant neoplasm of unspecified lung: Secondary | ICD-10-CM | POA: Diagnosis not present

## 2016-04-01 MED ORDER — METOPROLOL SUCCINATE ER 50 MG PO TB24: 150.0000 mg | ORAL_TABLET | Freq: Every day | ORAL | 11 refills | Status: DC

## 2016-04-01 NOTE — Progress Notes (Signed)
New Outpatient Visit Date: 04/01/2016  Referring Provider: Lorna Few, DO [address], [place] [id number]  Chief Complaint: Follow-up acute on chronic systolic heart failure with recent hospitalization  HPI:  Anna Gray is a 75 y.o. 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 Gerlean Ren for evaluation of recent hospitalization with acute on chronic heart failure. The patient was admitted at [facility] from 10/4 through 03/14/16 due to worsening shortness of breath secondary to decompensated heart failure. Her hospitalization was complicated by acute on chronic kidney disease and hypoxic respiratory failure. She was carefully diuresed and today reports that her breathing has been quite limited. She has not had a significant leg swelling either. She sleeps in a recliner, as she was told that this will help with her heart failure.  She has not tried sleeping better lying flat out of fear that this may cause her shortness of breath to return. Patient has not had any chest pain or lightheadedness. She notes occasional palpitations lasting a few seconds without accompanying symptoms like lightheadedness or chest discomfort.  Circumstances regarding her diagnosis of heart disease in July are still somewhat unclear. The patient reports that after coming to [place] on 12/06/15, she began feeling unwell with shortness of breath and fatigue. She was ultimately admitted to the hospital in [place], [place], where she was hospitalized the majority of the time until she came to [place] one month ago. There, she was first given the diagnosis of heart failure and also underwent pacemaker placement because her "heart was out of sync."  She does not believe that she underwent cardiac catheterization or stress testing at the time, though she is somewhat unclear about the events during the hospital stay. She was also informed that her liver cancer may have recurred somewhere in the chest. It had previously been stable for several years after undergoing chemoembolization.  The patient reports that she is currently waiting to establish with an oncologist in [place], though the referral has been delayed because the practice has not received outside records.  --------------------------------------------------------------------------------------------------  Cardiovascular History & Procedures: Cardiovascular Problems:  Systolic heart failure  Atrial tachycardia  Risk Factors:  Age, hypertension, and diabetes mellitus  Cath/PCI:  None  CV Surgery:  None  EP Procedures and Devices:  Dual-chamber pacemaker ([employer], 12/2015)  Non-Invasive Evaluation(s):  Transthoracic echocardiogram (03/10/16): Normal LV size with moderate LVH. LVEF is severely reduced at 30-35% with akinesis of the anterior and apical myocardium. Mild AI and mild to moderate MR. Mild biatrial enlargement. Mild to moderate TR. Moderate pulmonary hypertension (PASP 66 mmHg).  Recent CV Pertinent Labs: Lab Results  Component Value Date   CHOL 150 03/02/2016   HDL 56 03/02/2016   LDLCALC 65 03/02/2016   TRIG 145 03/02/2016   CHOLHDL 2.7 03/02/2016   BNP 1,185.9 (H) 03/09/2016   K 5.0 03/21/2016   BUN 56 (H) 03/21/2016   CREATININE 3.41 (H) 03/21/2016    --------------------------------------------------------------------------------------------------  Past Medical History:  Diagnosis Date  . Anxiety   . CHF (congestive heart failure) ([place])   . Chronic kidney disease   . Depression   . Diabetes mellitus without complication ([place])   . Hepatocellular carcinoma ([place])   . Hypertension   . Presence of permanent cardiac pacemaker      Past  Surgical History:  Procedure Laterality Date  . embolization     Chemoembolization of HCC  . INSERT / REPLACE / REMOVE PACEMAKER      Outpatient Encounter Prescriptions as of 04/01/2016  Medication Sig  . aspirin EC 81 MG tablet Take 1 tablet (81 mg total) by mouth daily.  . furosemide (LASIX) 80 MG tablet Take 1 tablet (80 mg total) by mouth 2 (two) times daily.  . hydrALAZINE (APRESOLINE) 25 MG tablet Take 1 tablet (25 mg total) by mouth 3 (three) times daily.  Marland Kitchen HYDROmorphone (DILAUDID) 2 MG tablet Take 1 tablet (2 mg total) by mouth every 8 (eight) hours as needed for severe pain.  . Insulin Glargine (LANTUS) 100 UNIT/ML Solostar Pen Inject 10 Units into the skin daily at 10 pm.  . isosorbide mononitrate (IMDUR) 30 MG 24 hr tablet Take 1 tablet (30 mg total) by mouth daily.  Marland Kitchen LORazepam (ATIVAN) 0.5 MG tablet Take 1 tablet (0.5 mg total) by mouth every 8 (eight) hours as needed for anxiety.  . metoCLOPramide (REGLAN) 5 MG/5ML solution Take 2.5 mLs (2.5 mg total) by mouth 4 (four) times daily -  before meals and at bedtime.  . metoprolol succinate (TOPROL-XL) 50 MG 24 hr tablet Take 3 tablets (150 mg total) by mouth daily. Take with or immediately following a meal.  . Multiple Vitamin (MULTIVITAMIN WITH MINERALS) TABS tablet Take 1 tablet by mouth daily.  Marland Kitchen NIFEdipine (PROCARDIA XL/ADALAT-CC) 90 MG 24 hr tablet Take 1 tablet (90 mg total) by mouth daily.  . pantoprazole (PROTONIX) 40 MG tablet Take 1 tablet (40 mg total) by mouth daily.  . potassium chloride SA (K-DUR,KLOR-CON) 20 MEQ tablet Take 2 tablets (40 mEq total) by mouth daily.  . [DISCONTINUED] metoprolol succinate (TOPROL-XL) 100 MG 24 hr tablet Take 1 tablet (100 mg total) by mouth daily. Take with or immediately following a meal.   No facility-administered encounter medications on file as of 04/01/2016.     Allergies: Vicodin [hydrocodone-acetaminophen]  Social History   Social History  . Marital status:  Divorced    Spouse name: N/A  . Number of children: N/A  . Years of education: N/A   Occupational History  . Not on file.   Social History Main Topics  . Smoking status: Former Smoker    Packs/day: 0.50    Years: 25.00    Types: Cigarettes    Quit date: 12/11/2015  . Smokeless tobacco: Never Used     Comment: hasn't smoked in past 4 months  . Alcohol use No  . Drug use: No  . Sexual activity: No   Other Topics Concern  . Not on file   Social History Narrative  . No narrative on file    Family History  Problem Relation Age of Onset  . Other Mother     Car accident  . Hypertension Mother   . Other Father     Car accident  . Hypertension Sister     Review of Systems: Review of Systems  Constitutional: Positive for weight loss (poor appetite).  HENT: Negative.   Respiratory: Negative.   Cardiovascular: Positive for palpitations and leg swelling.  Gastrointestinal: Positive for abdominal pain, constipation and nausea.  Musculoskeletal: Negative.   Skin: Negative.   Neurological:       Balance/walking problems  Endo/Heme/Allergies: Negative.   Psychiatric/Behavioral: Positive for depression. The patient is nervous/anxious.    --------------------------------------------------------------------------------------------------  Physical Exam: BP 130/76   Pulse 75   Ht 5' 2.5" (  1.588 m)   Wt 104 lb (47.2 kg)   BMI 18.72 kg/m   General:  Frail, elderly woman seated on the exam table. She is accompanied by her daughter. HEENT: No scleral icterus. Conjunctival pallor is evident.  Moist mucous membranes.  OP clear. Neck: Supple without lymphadenopathy, thyromegaly, JVD, or HJR.  No carotid bruit. Lungs: Normal work of breathing.  Clear to auscultation bilaterally without wheezes or crackles. Heart: Regular rate and rhythm without murmurs, rubs, or gallops.  Non-displaced PMI. Abd: Bowel sounds present.  Soft, NT/ND without hepatosplenomegaly Ext: No lower extremity  edema.  Radial, PT, and DP pulses are 2+ bilaterally Skin: warm and dry without rash Neuro: CNIII-XII intact.  Strength and fine-touch sensation intact in upper and lower extremities bilaterally. Psych: Normal mood and affect.  EKG:  Normal sinus rhythm with atrial he sensed and ventricularly paced rhythm.  Lab Results  Component Value Date   WBC 12.5 (H) 03/21/2016   HGB 9.6 (L) 03/21/2016   HCT 28.5 (L) 03/21/2016   MCV 76.8 (L) 03/21/2016   PLT 523 (H) 03/21/2016    Lab Results  Component Value Date   NA 138 03/21/2016   K 5.0 03/21/2016   CL 101 03/21/2016   CO2 22 03/21/2016   BUN 56 (H) 03/21/2016   CREATININE 3.41 (H) 03/21/2016   GLUCOSE 202 (H) 03/21/2016   ALT 25 03/10/2016    Lab Results  Component Value Date   CHOL 150 03/02/2016   HDL 56 03/02/2016   LDLCALC 65 03/02/2016   TRIG 145 03/02/2016   CHOLHDL 2.7 03/02/2016   --------------------------------------------------------------------------------------------------  ASSESSMENT AND PLAN: 1. Chronic systolic congestive heart failure ([place]) The patient appears euvolemic on exam today. Her functional capacity is quite limited, though this is likely multifactorial and not simply due to her chronic systolic heart failure. Based on her echocardiogram last month demonstrating a significant anterior wall motion abnormality, I have a suspicion that her cardiomyopathy is likely ischemic in etiology. It does not sound like she has undergone prior ischemia evaluation, though. Ideally, we would proceed with coronary angiography, but given her chronic kidney disease and desire to avoid hemodialysis, we have agreed to perform a pharmacologic myocardial perfusion stress test. We will increase her metoprolol succinate to 150 mg daily in order to optimize her evidence-based heart failure therapy as well as to suppress atrial tachycardia that was noted during her recent hospitalization. We will also continue with her current doses  of hydralazine and isosorbide mononitrate. Addition of ACE inhibitor/ARB and aldosterone antagonist is precluded by her advanced kidney disease.  I have instructed the patient to weigh herself daily and to take an extra dose of furosemide if she gains more than 2 pounds in 24 hours or 5 pounds in a week.  2. Atrial tachycardia s/p placement of cardiac pacemaker Patient was noted to have runs of atrial tachycardia during her recent hospitalization, which may be causing the intermittent palpitations that she has noted since discharge.  Her EKG today demonstrates normal sinus rhythm with ventricular pacing.  We will increase her metoprolol to 150 mg daily and refer the patient to electrophysiology to establish routine monitoring of her pacemaker.  If she is pacemaker dependent, she may benefit from biventricular pacing given her low LVEF.  However, in the setting of recurrent malignancy, this discussion will be deferred until her long-term prognosis is better understood.  3. Malignant neoplasm metastatic to lung, unspecified laterality [facility]) The patient reports being diagnosed with [place] around 2009  and is status post chemoembolization.  She is scheduled to be seen by oncology on 04/12/16 for further evaluation.  Follow-up: Return to clinic in 1 month.  Nelva Bush, MD 04/02/2016 3:40 PM

## 2016-04-01 NOTE — Patient Instructions (Signed)
Medication Instructions:  Your physician has recommended you make the following change in your medication:  INCREASE Metoprolol to 150mg  daily. An Rx has been sent to your pharmacy   Labwork: None ordered  Testing/Procedures: Your physician has requested that you have a lexiscan myoview. For further information please visit [email or web address]. Please follow instruction sheet, as given.   Follow-Up: Your physician recommends that you schedule a follow-up appointment in: 4 weeks with Dr.End  You have been referred to EP Dr.Taylor ( To establish care for device interrogation)     Any Other Special Instructions Will Be Listed Below (If Applicable). We will request records from your hospitalization in [place]    If you need a refill on your cardiac medications before your next appointment, please call your pharmacy.

## 2016-04-04 ENCOUNTER — Other Ambulatory Visit: Payer: Self-pay | Admitting: Family Medicine

## 2016-04-04 NOTE — Telephone Encounter (Signed)
Pt needs a refill on lorazepam. Pt states she is out and very time pt goes without this medication pt goes into the hospital. Please advise. Thanks! ep

## 2016-04-05 ENCOUNTER — Other Ambulatory Visit: Payer: Self-pay | Admitting: Family Medicine

## 2016-04-05 MED ORDER — LORAZEPAM 0.5 MG PO TABS: 0.5000 mg | ORAL_TABLET | Freq: Three times a day (TID) | ORAL | 0 refills | Status: DC | PRN

## 2016-04-05 NOTE — Progress Notes (Signed)
Prescription printed and left at front desk.

## 2016-04-05 NOTE — Progress Notes (Signed)
Patient informed. Haillie Radu T Contina Strain, CMA  

## 2016-04-12 ENCOUNTER — Other Ambulatory Visit: Payer: Medicare Other

## 2016-04-12 ENCOUNTER — Ambulatory Visit: Payer: Medicare Other

## 2016-04-12 ENCOUNTER — Encounter: Payer: Medicare Other | Admitting: Hematology & Oncology

## 2016-04-13 ENCOUNTER — Ambulatory Visit: Payer: Self-pay | Admitting: Internal Medicine

## 2016-04-14 NOTE — Progress Notes (Signed)
This encounter was created in error - please disregard.

## 2016-04-17 ENCOUNTER — Other Ambulatory Visit: Payer: Self-pay | Admitting: Family Medicine

## 2016-04-18 ENCOUNTER — Other Ambulatory Visit: Payer: Self-pay | Admitting: Family Medicine

## 2016-04-18 NOTE — Telephone Encounter (Signed)
Pt needs a refill on Ativan today please. Pt states she is out of this medication and needs it ASAP because every time pt is out of medication she has to go into the hospital. Please advise. Thanks! ep

## 2016-04-18 NOTE — Telephone Encounter (Signed)
Pt had a gentleman pick this up today. Katharina Caper, Iyan Flett D, [place]

## 2016-04-18 NOTE — Telephone Encounter (Signed)
A prescription was left at the front desk on 10/31 and has not been picked up.

## 2016-04-18 NOTE — Telephone Encounter (Signed)
Pt is calling back because she needs to know when the doctor is going to send in her Ativan. She can not wait much longer because if she does she will end up back in the ER. Please let patient know when this will be done. jw

## 2016-04-19 ENCOUNTER — Telehealth (HOSPITAL_COMMUNITY): Payer: Self-pay | Admitting: *Deleted

## 2016-04-19 ENCOUNTER — Telehealth: Payer: Self-pay | Admitting: Internal Medicine

## 2016-04-19 NOTE — Telephone Encounter (Signed)
She has gotten this but it wont let me close it due to needing to be signed. Anna Gray, Anna Gray, Anna Gray

## 2016-04-19 NOTE — Telephone Encounter (Signed)
Left message on voicemail in reference to upcoming appointment scheduled for 04/25/16. Phone number given for a call back so details instructions can be given. Hubbard Robinson , RN

## 2016-04-19 NOTE — Telephone Encounter (Signed)
New message  Pt's Daughter is returning Crissie Figures

## 2016-04-20 ENCOUNTER — Encounter (HOSPITAL_COMMUNITY): Payer: Medicare Other

## 2016-04-22 ENCOUNTER — Encounter: Payer: Self-pay | Admitting: Family Medicine

## 2016-04-22 ENCOUNTER — Ambulatory Visit (INDEPENDENT_AMBULATORY_CARE_PROVIDER_SITE_OTHER): Payer: Medicare Other | Admitting: Family Medicine

## 2016-04-22 ENCOUNTER — Encounter: Payer: Medicare Other | Admitting: Internal Medicine

## 2016-04-22 DIAGNOSIS — F1320 Sedative, hypnotic or anxiolytic dependence, uncomplicated: Secondary | ICD-10-CM | POA: Diagnosis present

## 2016-04-22 DIAGNOSIS — C220 Liver cell carcinoma: Secondary | ICD-10-CM

## 2016-04-22 DIAGNOSIS — I5022 Chronic systolic (congestive) heart failure: Secondary | ICD-10-CM | POA: Diagnosis not present

## 2016-04-22 MED ORDER — HYDRALAZINE HCL 25 MG PO TABS: 25.0000 mg | ORAL_TABLET | Freq: Three times a day (TID) | ORAL | 4 refills | Status: DC

## 2016-04-22 MED ORDER — METOPROLOL SUCCINATE ER 50 MG PO TB24: 150.0000 mg | ORAL_TABLET | Freq: Every day | ORAL | 11 refills | Status: AC

## 2016-04-22 MED ORDER — ISOSORBIDE MONONITRATE ER 30 MG PO TB24: 30.0000 mg | ORAL_TABLET | Freq: Every day | ORAL | 4 refills | Status: AC

## 2016-04-22 MED ORDER — POTASSIUM CHLORIDE CRYS ER 20 MEQ PO TBCR: 40.0000 meq | EXTENDED_RELEASE_TABLET | Freq: Every day | ORAL | 3 refills | Status: AC

## 2016-04-22 MED ORDER — FUROSEMIDE 80 MG PO TABS: 80.0000 mg | ORAL_TABLET | Freq: Two times a day (BID) | ORAL | 4 refills | Status: AC

## 2016-04-22 MED ORDER — TRAZODONE HCL 50 MG PO TABS: 50.0000 mg | ORAL_TABLET | Freq: Every evening | ORAL | 3 refills | Status: DC | PRN

## 2016-04-22 MED ORDER — INSULIN GLARGINE 100 UNIT/ML SOLOSTAR PEN: 10.0000 [IU] | PEN_INJECTOR | Freq: Every day | SUBCUTANEOUS | 11 refills | Status: AC

## 2016-04-22 MED ORDER — HYDROMORPHONE HCL 2 MG PO TABS: 2.0000 mg | ORAL_TABLET | Freq: Three times a day (TID) | ORAL | 0 refills | Status: DC | PRN

## 2016-04-22 MED ORDER — NIFEDIPINE ER OSMOTIC RELEASE 90 MG PO TB24: 90.0000 mg | ORAL_TABLET | Freq: Every day | ORAL | 4 refills | Status: AC

## 2016-04-22 MED ORDER — LORAZEPAM 0.5 MG PO TABS: ORAL_TABLET | ORAL | 0 refills | Status: AC

## 2016-04-22 NOTE — Assessment & Plan Note (Addendum)
Patient has been taking lorazepam 0.5 mg nightly with instructions to take this as needed and is typically prescribed 15 pills per month and has been coming in every 2 weeks for refills.  Discussed with the patient my concerns for this medication and she agrees to taper off.   - Prescribed for pills of 0.5 mg with instructions to take half a tablet at night for the next week - Prescribed 50 mg trazodone to take at night with instructions to begin this once she tapers off of lorazepam - Plan to follow-up in one month to see how sleeping is going

## 2016-04-22 NOTE — Assessment & Plan Note (Signed)
Follow-up the doctor and cardiology is currently on metoprolol succinate 150 mg and Imdur 30 mg daily, hydralazine 25 mg daily Lasix 80 mg twice daily. Denies any shortness of breath, chest pain or fluid overload.  Physical exam is notable for bibasilar crackles.  She's satting 99% on room air.  - Refilled medications - Recommended follow-up 1 month to assess sleeping status and can reassess fluid status at that point as well

## 2016-04-22 NOTE — Progress Notes (Signed)
Subjective:  Anna Gray is a 75 y.o. female who presents to the [facility] today for medication refills  HPI:  Chronic systolic heart failure: Patient recently admitted for heart failure exacerbation and recent echo showed EF of 30-35%.  Recently moved from [place] and is currently being followed by Dr. Saunders Revel in cardiology.  She is taking metoprolol succinate 150 mg daily, Imdur 30 mg daily, hydralazine 25 mg daily and Lasix 80 mg twice a day.  She denies any chest pain, shortness of breath or fluid overload.  Weight today was 106 which is up 2 pounds from 04/01/2016.  Her cardiologist gave her instructions to take an extra Lasix pill when she is up 2 pounds in 24 hours or up 5 pounds over the course of 5 days.    Hepatocellular carcinoma with metastasis to lungs: Patient reportedly has a history of metastatic breast adenocarcinoma and she is status post ablation and not receiving any medication currently.  She does receive 2 mg Dilaudid every 8 hours for chronic pain as needed.  She noted that she has been receiving imaging every 3 months for the past several years back home, has not had any imaging done since March of this year.  She does not have her medical records and has had difficulties getting in to see an oncologist for this reason. Denies any constitutional symptoms.   Benzodiazepine use: Patient reportedly takes 0.5 mg lorazepam at night she started this several months ago after a recent admission. In the nighttime she starts to feel anxious due to worrying about getting shortness of breath.  She has been prescribed 15 pills to take this when necessary, however she has been coming in every 2 weeks for refills and would like to get off this medication but worries that she might withdraw. She does not take any alcohol.   PMH: Hypertension, type 2 diabetes, CAD, hepatocellular carcinoma Tobacco use: Former smoker Medication: reviewed and updated ROS: see HPI   Objective:  Physical  Exam: BP (!) 152/67 (BP Location: Left Arm, Patient Position: Sitting, Cuff Size: Normal)   Pulse 69   Temp 97.8 F (36.6 C) (Oral)   Wt 106 lb (48.1 kg)   SpO2 99%   BMI 19.08 kg/m   Gen: 75 year old female in NAD, resting comfortably CV: RRR with no murmurs appreciated Pulm: NWOB, crackles in the bilateral bases without wheezes, or rhonchi GI: Normal bowel sounds present. Soft, Nontender, Nondistended. Extremities: No cyanosis clubbing or edema Neuro: grossly normal, moves all extremities Psych: Normal affect and thought content  No results found for this or any previous visit (from the past 72 hour(s)).   Assessment/Plan:  Benzodiazepine dependence ([place]) Patient has been taking lorazepam 0.5 mg nightly with instructions to take this as needed and is typically prescribed 15 pills per month and has been coming in every 2 weeks for refills.  Discussed with the patient my concerns for this medication and she agrees to taper off.   - Prescribed for pills of 0.5 mg with instructions to take half a tablet at night for the next week - Prescribed 50 mg trazodone to take at night with instructions to begin this once she tapers off of lorazepam - Plan to follow-up in one month to see how sleeping is going  Malignant neoplasm of liver ([place]) History of hepatocellular carcinoma with metastasis to lungs. Did not have medical records and therefore do not have details on her treatment. However from recent admission at [facility] she apparently  had an ablation and is not receiving any medication.  We'll need to look out for her medical records in order to be able to get her in to see oncologist.  She did sign a release form at the end of October.  Denies any abdominal pain, shortness of breath or constitutional symptoms.  - Follow-up medical records - Oncology referral when possible  Chronic systolic congestive heart failure ([place]) Follow-up the doctor and cardiology is currently on metoprolol  succinate 150 mg and Imdur 30 mg daily, hydralazine 25 mg daily Lasix 80 mg twice daily. Denies any shortness of breath, chest pain or fluid overload.  Physical exam is notable for bibasilar crackles.  She's satting 99% on room air.  - Refilled medications - Recommended follow-up 1 month to assess sleeping status and can reassess fluid status at that point as well

## 2016-04-22 NOTE — Assessment & Plan Note (Signed)
History of hepatocellular carcinoma with metastasis to lungs. Did not have medical records and therefore do not have details on her treatment. However from recent admission at [facility] she apparently had an ablation and is not receiving any medication.  We'll need to look out for her medical records in order to be able to get her in to see oncologist.  She did sign a release form at the end of October.  Denies any abdominal pain, shortness of breath or constitutional symptoms.  - Follow-up medical records - Oncology referral when possible

## 2016-04-22 NOTE — Patient Instructions (Addendum)
Anna Gray, you were see today for medication refills for your heart failure. On exam I heard very minimal crackles in the bottoms of both lungs however your oxygen saturation was 99% on room air and I did not see any fluid overload in your legs.    We discussed her lorazepam that you have been taking at night and you expressed interest in stopping this.  I have prescribed you 4 pills of the 0.5 mg and I would like you to split this in half and you can take that at night for the next week.  After that you can take trazodone at night and this should help with your feeling anxious and sleeping difficulties.  I would like to see back in 1 month to see how you're doing on the trazodone.  Very nice meeting you, Quillian Quince L. Rosalyn Gess, MD 04/22/2016 12:15 PM

## 2016-04-23 ENCOUNTER — Other Ambulatory Visit: Payer: Self-pay | Admitting: Family Medicine

## 2016-04-23 MED ORDER — "INSULIN SYRINGE/NEEDLE 28G X 1/2"" 1 ML MISC": 10.0000 [IU] | Freq: Every day | 10 refills | Status: AC

## 2016-04-25 ENCOUNTER — Encounter (HOSPITAL_COMMUNITY): Payer: Medicare Other

## 2016-05-04 ENCOUNTER — Encounter: Payer: Medicare Other | Admitting: Internal Medicine

## 2016-05-05 ENCOUNTER — Encounter: Payer: Self-pay | Admitting: Internal Medicine

## 2016-05-09 ENCOUNTER — Telehealth (HOSPITAL_COMMUNITY): Payer: Self-pay | Admitting: Internal Medicine

## 2016-05-10 ENCOUNTER — Telehealth: Payer: Self-pay | Admitting: Family Medicine

## 2016-05-10 NOTE — Telephone Encounter (Signed)
Daughter would like another referral to nephrology in [place].  She is having problems getting into [facility].  They have verifed receipt of the medical records but will not schedule an appt for some reason.  Please call daughter to let her know when the referral has been sent and where it was sent.

## 2016-05-10 NOTE — Telephone Encounter (Signed)
I will not be able to send a referral to [facility] without the medical records from patient's MDs in [place] and [place]. I attempted this in September. [facility] requires at least 8 OV notes, 2 years worth of labs for review before they will schedule patient.

## 2016-05-11 NOTE — Telephone Encounter (Signed)
The notes from pt's other MDs were never sent to [facility] because they were never given to me. The original was sent to [facility] so I have asked Raquel Sarna to fax the outside MR to them as well. I will f/u with their office on Friday when I return to the office.   [place]

## 2016-05-11 NOTE — Telephone Encounter (Signed)
Daughter called again, stating that [facility] never received the fax from [place] with notes from [place]. Daughter really wants to get her mom scheduled with the nephrologist. I checked Dr. Johnathan Hausen box and there is a fax in there dated 11/29, office notes from previous doctor. Please let me know where you want them sent, [facility] or [place]. Daughter is fine with [facility], doesn't really care where pt goes as long as she gets seen ASAP. Tia feel free to skype me and let me know what you want me to do. Please advise. Thanks! ep

## 2016-05-12 NOTE — Telephone Encounter (Signed)
Pt's daughter called back and I spoke with her to schedule the patient's appt.

## 2016-05-23 ENCOUNTER — Telehealth: Payer: Self-pay | Admitting: *Deleted

## 2016-05-23 ENCOUNTER — Institutional Professional Consult (permissible substitution): Payer: Medicare Other | Admitting: Emergency Medicine

## 2016-05-23 NOTE — Telephone Encounter (Signed)
Patient would like a referral to see urology for urinary incontinence.  She states that if she doesn't go to the bathroom when the urge hits she ends up urinating on herself.  Informed patient that she may need to come in and discuss this with PCP but that we would check with her first.  Also needs a refill on her medication. Jazmin Hartsell,CMA

## 2016-05-24 NOTE — Telephone Encounter (Signed)
Please have her schedule an appointment to be seen

## 2016-05-25 ENCOUNTER — Institutional Professional Consult (permissible substitution): Payer: Medicare Other | Admitting: Pulmonary Disease

## 2016-05-25 ENCOUNTER — Telehealth: Payer: Self-pay | Admitting: Pulmonary Disease

## 2016-05-25 NOTE — Telephone Encounter (Signed)
Pt has appointment to discuss this on 05/27/2016. Katharina Caper, April D, [place]

## 2016-05-25 NOTE — Telephone Encounter (Signed)
Left message for patient to call office back. If she does please schedule her an appointment. Nat Christen, CMA

## 2016-05-25 NOTE — Telephone Encounter (Signed)
IMAGING CXR PA/LAT 03/12/16 (personally reviewed by me): Pleural effusions noted on lateral imaging. Silhouetting of bilateral hemidiaphragms could be consistent with atelectasis versus pleural effusion. Comparing with previous x-ray imaging from 10/4 their was improvement in left upper lung patchy opacification and right hilar patchy opacification. Decreased alveolar opacities bilaterally.  CARDIAC TTE (03/10/16):  Excellent movement in the base with apical ballooning that can sometimes be seen with Takatsubo cardiomyopathy. Normal cavity size. Moderate LVH with EF 30-35%. Akinesis of anterior and apical myocardium noted. LA & RA mildly dilated. RV normal in size and function. Pulmonary artery systolic pressure 66 mmHg. Mild aortic regurgitation without stenosis. Aortic root normal in size. Mild to moderate mitral regurgitation without stenosis. No pulmonic stenosis. Mild to moderate tricuspid regurgitation. No pericardial effusion.  LABS 03/21/16 CBC:  12.5/9.6/28.5/523 BMP:  138/5.0/101/22/56/3.41/202/9.8

## 2016-05-26 ENCOUNTER — Telehealth: Payer: Self-pay

## 2016-05-26 MED ORDER — HYDROMORPHONE HCL 2 MG PO TABS: 2.0000 mg | ORAL_TABLET | Freq: Three times a day (TID) | ORAL | 0 refills | Status: AC | PRN

## 2016-05-26 NOTE — Telephone Encounter (Signed)
Pt informed. Alexxis Mackert T Caya Soberanis, CMA  

## 2016-05-26 NOTE — Telephone Encounter (Signed)
Would like to get a refill on Dilaudid. Please call when ready for pick up. Ottis Stain, CMA

## 2016-05-26 NOTE — Telephone Encounter (Signed)
Prescription filled and left at front desk.

## 2016-05-27 ENCOUNTER — Ambulatory Visit: Payer: Medicare Other | Admitting: Family Medicine

## 2016-06-02 ENCOUNTER — Encounter: Payer: Self-pay | Admitting: Family Medicine

## 2016-06-02 ENCOUNTER — Ambulatory Visit (INDEPENDENT_AMBULATORY_CARE_PROVIDER_SITE_OTHER): Payer: Medicare Other | Admitting: Family Medicine

## 2016-06-02 VITALS — BP 112/60 | HR 86 | Temp 97.9°F | Ht 62.5 in | Wt 109.8 lb

## 2016-06-02 DIAGNOSIS — I5022 Chronic systolic (congestive) heart failure: Secondary | ICD-10-CM | POA: Diagnosis present

## 2016-06-02 NOTE — Progress Notes (Signed)
    Subjective:  Anna Gray is a 75 y.o. female who presents to the [facility] today with a chief complaint of swelling in hands and feet.   HPI: Per chart review, has h/o HFrEF 30-35% likely ischemic with myocardial perfusion study scheduled for 06/13/16. Last saw cardiology on 04/01/16 at which time was instructed to weigh self daily and give self extra dose of lasix if weight increased by 2 lbs in 24 hours or 5 lbs in 1 week.  States started having swelling b/l feet to ankles yesterday with some mild swelling in L hand. Has been complaint on toprol xl, imdur, hydralazine and lasix recently.  Has not been weighing self at home because does not have home scale so has not taken extra lasix dose as instructed.  No recent illness, no changes in diet.  Denies CP, SOB, worsening orthopnea (at baseline sleeps with head at 50-60degrees elevated), fever/chills, cough.   ROS: Per HPI  Objective:  Physical Exam: BP 112/60 (BP Location: Left Arm, Patient Position: Sitting, Cuff Size: Normal)   Pulse 86   Temp 97.9 F (36.6 C) (Oral)   Ht 5' 2.5" (1.588 m)   Wt 109 lb 12.8 oz (49.8 kg)   SpO2 98%   BMI 19.76 kg/m   Gen: NAD, resting comfortably CV: RRR with no murmurs appreciated. No JVD Pulm: NWOB, CTAB with no crackles, wheezes, or rhonchi Extremities: Slight nonpitting edema to b/l ankles. Neuro: grossly normal, moves all extremities Psych: Normal affect and thought content   Assessment/Plan:  Chronic systolic congestive heart failure (HCC) Unlikely to be in HF exacerbation since patient has been compliant on heart failure medications and BP is well controlled today. Does have very slight b/l LE edema to ankles and is up 5 lbs from dry weight of 104lbs today. Advised patient to take additional dose of lasix today only and call back tomorrow if feels is not improved or worsened. Encouraged patient to purchase home scale so that she might monitor at home and reiterated cardiology recommedations  to extra dose of lasix if weight increased by 2 lbs in 24 hours or 5 lbs in 1 week.   Bufford Lope, DO PGY-1, [facility] 06/02/2016 10:39 AM

## 2016-06-02 NOTE — Patient Instructions (Addendum)
It was good to meet you today!  For your heart failure  - Please take lasix (furosemide) 80mg  three times a day today only and call us back tomorrow if your swelling has not improved or worsened. - You should get a home scale and take an extra dose of lasix (furosemide) if you gain more than 2 pounds in 24 hours or 5 pounds in a week. - Things to watch out for: shortness or breath or wheezing, if you are having trouble laying flat due to breathing issues, worsening swelling in your legs.  It is really important that you get that stress test of your heart and get a follow up with your cardiologist.   Take care and seek immediate care sooner if you develop any concerns.   Dr. Bufford Lope, [place]

## 2016-06-02 NOTE — Assessment & Plan Note (Signed)
Unlikely to be in HF exacerbation since patient has been compliant on heart failure medications and BP is well controlled today. Does have very slight b/l LE edema to ankles and is up 5 lbs from dry weight of 104lbs today. Advised patient to take additional dose of lasix today only and call back tomorrow if feels is not improved or worsened. Encouraged patient to purchase home scale so that she might monitor at home and reiterated cardiology recommedations to extra dose of lasix if weight increased by 2 lbs in 24 hours or 5 lbs in 1 week.

## 2016-06-04 ENCOUNTER — Other Ambulatory Visit: Payer: Self-pay | Admitting: Family Medicine

## 2016-06-08 ENCOUNTER — Telehealth (HOSPITAL_COMMUNITY): Payer: Self-pay | Admitting: *Deleted

## 2016-06-08 NOTE — Telephone Encounter (Signed)
Left message on voicemail in reference to upcoming appointment scheduled for 06/13/16. Phone number given for a call back so details instructions can be given. Anna Gray, Ranae Palms

## 2016-06-09 ENCOUNTER — Telehealth (HOSPITAL_COMMUNITY): Payer: Self-pay | Admitting: *Deleted

## 2016-06-09 NOTE — Telephone Encounter (Signed)
Left message on voicemail in reference to upcoming appointment scheduled for 06/13/16 . Phone number given for a call back so details instructions can be given. Anna Gray, Anna Gray

## 2016-06-13 ENCOUNTER — Encounter (HOSPITAL_COMMUNITY): Payer: Medicare Other

## 2016-06-15 ENCOUNTER — Telehealth (HOSPITAL_COMMUNITY): Payer: Self-pay | Admitting: Internal Medicine

## 2016-06-15 NOTE — Telephone Encounter (Signed)
Called pt to see if she would like to reschedule the myoview that she missed on 06/13/16. She voiced that she is back home in [place] taking care of some things and will call us back when she returns to get his rescheduled. For this reason she will also be removed from the workqueue.

## 2016-07-02 ENCOUNTER — Other Ambulatory Visit: Payer: Self-pay | Admitting: Family Medicine

## 2017-01-15 ENCOUNTER — Inpatient Hospital Stay (HOSPITAL_COMMUNITY): Payer: Medicare Other

## 2017-01-15 ENCOUNTER — Emergency Department (HOSPITAL_COMMUNITY): Payer: Medicare Other

## 2017-01-15 ENCOUNTER — Encounter (HOSPITAL_COMMUNITY): Payer: Self-pay | Admitting: Radiology

## 2017-01-15 ENCOUNTER — Inpatient Hospital Stay (HOSPITAL_COMMUNITY)
Admission: EM | Admit: 2017-01-15 | Discharge: 2017-02-04 | DRG: 208 | Disposition: E | Payer: Medicare Other | Attending: Nephrology | Admitting: Nephrology

## 2017-01-15 DIAGNOSIS — Z7189 Other specified counseling: Secondary | ICD-10-CM | POA: Diagnosis not present

## 2017-01-15 DIAGNOSIS — E872 Acidosis: Secondary | ICD-10-CM | POA: Diagnosis present

## 2017-01-15 DIAGNOSIS — R262 Difficulty in walking, not elsewhere classified: Secondary | ICD-10-CM | POA: Diagnosis present

## 2017-01-15 DIAGNOSIS — G936 Cerebral edema: Secondary | ICD-10-CM | POA: Diagnosis present

## 2017-01-15 DIAGNOSIS — Z885 Allergy status to narcotic agent status: Secondary | ICD-10-CM

## 2017-01-15 DIAGNOSIS — N179 Acute kidney failure, unspecified: Secondary | ICD-10-CM | POA: Diagnosis not present

## 2017-01-15 DIAGNOSIS — C7800 Secondary malignant neoplasm of unspecified lung: Secondary | ICD-10-CM | POA: Diagnosis not present

## 2017-01-15 DIAGNOSIS — C7951 Secondary malignant neoplasm of bone: Secondary | ICD-10-CM | POA: Diagnosis present

## 2017-01-15 DIAGNOSIS — E875 Hyperkalemia: Secondary | ICD-10-CM | POA: Diagnosis present

## 2017-01-15 DIAGNOSIS — F419 Anxiety disorder, unspecified: Secondary | ICD-10-CM | POA: Diagnosis present

## 2017-01-15 DIAGNOSIS — Z515 Encounter for palliative care: Secondary | ICD-10-CM | POA: Diagnosis present

## 2017-01-15 DIAGNOSIS — J9601 Acute respiratory failure with hypoxia: Secondary | ICD-10-CM | POA: Diagnosis present

## 2017-01-15 DIAGNOSIS — E873 Alkalosis: Secondary | ICD-10-CM | POA: Diagnosis present

## 2017-01-15 DIAGNOSIS — E861 Hypovolemia: Secondary | ICD-10-CM | POA: Diagnosis not present

## 2017-01-15 DIAGNOSIS — C7801 Secondary malignant neoplasm of right lung: Secondary | ICD-10-CM | POA: Diagnosis present

## 2017-01-15 DIAGNOSIS — F329 Major depressive disorder, single episode, unspecified: Secondary | ICD-10-CM | POA: Diagnosis present

## 2017-01-15 DIAGNOSIS — Z4659 Encounter for fitting and adjustment of other gastrointestinal appliance and device: Secondary | ICD-10-CM | POA: Diagnosis not present

## 2017-01-15 DIAGNOSIS — C7802 Secondary malignant neoplasm of left lung: Principal | ICD-10-CM | POA: Diagnosis present

## 2017-01-15 DIAGNOSIS — J9811 Atelectasis: Secondary | ICD-10-CM | POA: Diagnosis present

## 2017-01-15 DIAGNOSIS — N83202 Unspecified ovarian cyst, left side: Secondary | ICD-10-CM | POA: Diagnosis present

## 2017-01-15 DIAGNOSIS — Z79899 Other long term (current) drug therapy: Secondary | ICD-10-CM

## 2017-01-15 DIAGNOSIS — Z681 Body mass index (BMI) 19 or less, adult: Secondary | ICD-10-CM | POA: Diagnosis not present

## 2017-01-15 DIAGNOSIS — I340 Nonrheumatic mitral (valve) insufficiency: Secondary | ICD-10-CM | POA: Diagnosis not present

## 2017-01-15 DIAGNOSIS — I361 Nonrheumatic tricuspid (valve) insufficiency: Secondary | ICD-10-CM | POA: Diagnosis not present

## 2017-01-15 DIAGNOSIS — Z66 Do not resuscitate: Secondary | ICD-10-CM | POA: Diagnosis present

## 2017-01-15 DIAGNOSIS — I5022 Chronic systolic (congestive) heart failure: Secondary | ICD-10-CM | POA: Diagnosis present

## 2017-01-15 DIAGNOSIS — R52 Pain, unspecified: Secondary | ICD-10-CM

## 2017-01-15 DIAGNOSIS — I132 Hypertensive heart and chronic kidney disease with heart failure and with stage 5 chronic kidney disease, or end stage renal disease: Secondary | ICD-10-CM | POA: Diagnosis present

## 2017-01-15 DIAGNOSIS — Z950 Presence of cardiac pacemaker: Secondary | ICD-10-CM

## 2017-01-15 DIAGNOSIS — I351 Nonrheumatic aortic (valve) insufficiency: Secondary | ICD-10-CM | POA: Diagnosis not present

## 2017-01-15 DIAGNOSIS — E43 Unspecified severe protein-calorie malnutrition: Secondary | ICD-10-CM | POA: Diagnosis present

## 2017-01-15 DIAGNOSIS — Z8249 Family history of ischemic heart disease and other diseases of the circulatory system: Secondary | ICD-10-CM

## 2017-01-15 DIAGNOSIS — C786 Secondary malignant neoplasm of retroperitoneum and peritoneum: Secondary | ICD-10-CM | POA: Diagnosis present

## 2017-01-15 DIAGNOSIS — C7931 Secondary malignant neoplasm of brain: Secondary | ICD-10-CM | POA: Diagnosis present

## 2017-01-15 DIAGNOSIS — E1122 Type 2 diabetes mellitus with diabetic chronic kidney disease: Secondary | ICD-10-CM | POA: Diagnosis present

## 2017-01-15 DIAGNOSIS — Z87891 Personal history of nicotine dependence: Secondary | ICD-10-CM

## 2017-01-15 DIAGNOSIS — R570 Cardiogenic shock: Secondary | ICD-10-CM | POA: Diagnosis not present

## 2017-01-15 DIAGNOSIS — N185 Chronic kidney disease, stage 5: Secondary | ICD-10-CM | POA: Diagnosis present

## 2017-01-15 DIAGNOSIS — C220 Liver cell carcinoma: Secondary | ICD-10-CM | POA: Diagnosis present

## 2017-01-15 DIAGNOSIS — G9349 Other encephalopathy: Secondary | ICD-10-CM | POA: Diagnosis present

## 2017-01-15 DIAGNOSIS — R0603 Acute respiratory distress: Secondary | ICD-10-CM | POA: Diagnosis present

## 2017-01-15 LAB — I-STAT VENOUS BLOOD GAS, ED
ACID-BASE DEFICIT: 16 mmol/L — AB (ref 0.0–2.0)
Bicarbonate: 10.2 mmol/L — ABNORMAL LOW (ref 20.0–28.0)
O2 Saturation: 97 %
PCO2 VEN: 26 mmHg — AB (ref 44.0–60.0)
TCO2: 11 mmol/L (ref 0–100)
pH, Ven: 7.203 — ABNORMAL LOW (ref 7.250–7.430)
pO2, Ven: 114 mmHg — ABNORMAL HIGH (ref 32.0–45.0)

## 2017-01-15 LAB — I-STAT ARTERIAL BLOOD GAS, ED
ACID-BASE DEFICIT: 13 mmol/L — AB (ref 0.0–2.0)
BICARBONATE: 13.3 mmol/L — AB (ref 20.0–28.0)
O2 Saturation: 99 %
PH ART: 7.551 — AB (ref 7.350–7.450)
PO2 ART: 93 mmHg (ref 83.0–108.0)
Pressure control: 1 cmH2O
TCO2: 14 mmol/L (ref 0–100)
pCO2 arterial: <15 mmHg — CL (ref 32.0–48.0)

## 2017-01-15 LAB — COMPREHENSIVE METABOLIC PANEL
ALBUMIN: 2.3 g/dL — AB (ref 3.5–5.0)
ALT: 48 U/L (ref 14–54)
AST: 123 U/L — AB (ref 15–41)
Alkaline Phosphatase: 138 U/L — ABNORMAL HIGH (ref 38–126)
Anion gap: 12 (ref 5–15)
BUN: 72 mg/dL — AB (ref 6–20)
CHLORIDE: 109 mmol/L (ref 101–111)
CO2: 13 mmol/L — ABNORMAL LOW (ref 22–32)
CREATININE: 3.83 mg/dL — AB (ref 0.44–1.00)
Calcium: 8.1 mg/dL — ABNORMAL LOW (ref 8.9–10.3)
GFR calc Af Amer: 12 mL/min — ABNORMAL LOW (ref >60)
GFR, EST NON AFRICAN AMERICAN: 11 mL/min — AB (ref >60)
GLUCOSE: 442 mg/dL — AB (ref 65–99)
POTASSIUM: 6.2 mmol/L — AB (ref 3.5–5.1)
Sodium: 134 mmol/L — ABNORMAL LOW (ref 135–145)
Total Bilirubin: 0.5 mg/dL (ref 0.3–1.2)
Total Protein: 6.2 g/dL — ABNORMAL LOW (ref 6.5–8.1)

## 2017-01-15 LAB — I-STAT CHEM 8, ED
BUN: 66 mg/dL — ABNORMAL HIGH (ref 6–20)
CALCIUM ION: 1.11 mmol/L — AB (ref 1.15–1.40)
CHLORIDE: 110 mmol/L (ref 101–111)
Creatinine, Ser: 3.6 mg/dL — ABNORMAL HIGH (ref 0.44–1.00)
Glucose, Bld: 487 mg/dL — ABNORMAL HIGH (ref 65–99)
HEMATOCRIT: 23 % — AB (ref 36.0–46.0)
HEMOGLOBIN: 7.8 g/dL — AB (ref 12.0–15.0)
POTASSIUM: 6.6 mmol/L — AB (ref 3.5–5.1)
SODIUM: 135 mmol/L (ref 135–145)
TCO2: 13 mmol/L (ref 0–100)

## 2017-01-15 LAB — CBC WITH DIFFERENTIAL/PLATELET
BASOS PCT: 0 %
Basophils Absolute: 0 10*3/uL (ref 0.0–0.1)
EOS ABS: 0.3 10*3/uL (ref 0.0–0.7)
EOS PCT: 2 %
HCT: 23.3 % — ABNORMAL LOW (ref 36.0–46.0)
Hemoglobin: 7.8 g/dL — ABNORMAL LOW (ref 12.0–15.0)
LYMPHS ABS: 3.1 10*3/uL (ref 0.7–4.0)
Lymphocytes Relative: 20 %
MCH: 24.6 pg — AB (ref 26.0–34.0)
MCHC: 33.5 g/dL (ref 30.0–36.0)
MCV: 73.5 fL — AB (ref 78.0–100.0)
MONO ABS: 0.5 10*3/uL (ref 0.1–1.0)
Monocytes Relative: 3 %
NEUTROS PCT: 75 %
Neutro Abs: 11.5 10*3/uL — ABNORMAL HIGH (ref 1.7–7.7)
PLATELETS: 430 10*3/uL — AB (ref 150–400)
RBC: 3.17 MIL/uL — ABNORMAL LOW (ref 3.87–5.11)
RDW: 18.5 % — AB (ref 11.5–15.5)
WBC: 15.4 10*3/uL — AB (ref 4.0–10.5)

## 2017-01-15 LAB — CBG MONITORING, ED
GLUCOSE-CAPILLARY: 414 mg/dL — AB (ref 65–99)
Glucose-Capillary: 461 mg/dL — ABNORMAL HIGH (ref 65–99)

## 2017-01-15 LAB — I-STAT TROPONIN, ED: TROPONIN I, POC: 0.08 ng/mL (ref 0.00–0.08)

## 2017-01-15 LAB — PROTIME-INR
INR: 1.25
Prothrombin Time: 15.8 seconds — ABNORMAL HIGH (ref 11.4–15.2)

## 2017-01-15 LAB — I-STAT CG4 LACTIC ACID, ED
Lactic Acid, Venous: 7.5 mmol/L (ref 0.5–1.9)
Lactic Acid, Venous: 3.27 mmol/L (ref 0.5–1.9)

## 2017-01-15 LAB — BRAIN NATRIURETIC PEPTIDE: B NATRIURETIC PEPTIDE 5: 1862.2 pg/mL — AB (ref 0.0–100.0)

## 2017-01-15 LAB — APTT: APTT: 32 s (ref 24–36)

## 2017-01-15 MED ORDER — FENTANYL 2500MCG IN NS 250ML (10MCG/ML) PREMIX INFUSION
0.0000 ug/h | INTRAVENOUS | Status: DC
  Administered 2017-01-15: 25 ug/h via INTRAVENOUS
  Administered 2017-01-16 (×2): 350 ug/h via INTRAVENOUS
  Administered 2017-01-16: 300 ug/h via INTRAVENOUS
  Administered 2017-01-17 – 2017-01-18 (×4): 400 ug/h via INTRAVENOUS
  Filled 2017-01-15 (×9): qty 250

## 2017-01-15 MED ORDER — ACETAMINOPHEN 325 MG PO TABS: 650.0000 mg | ORAL_TABLET | ORAL | Status: DC | PRN

## 2017-01-15 MED ORDER — MIDAZOLAM HCL 2 MG/2ML IJ SOLN
1.0000 mg | INTRAMUSCULAR | Status: AC | PRN
  Administered 2017-01-15 – 2017-01-16 (×3): 1 mg via INTRAVENOUS
  Filled 2017-01-15 (×2): qty 2

## 2017-01-15 MED ORDER — FENTANYL CITRATE (PF) 100 MCG/2ML IJ SOLN
50.0000 ug | Freq: Once | INTRAMUSCULAR | Status: AC
  Administered 2017-01-15: 50 ug via INTRAVENOUS

## 2017-01-15 MED ORDER — FAMOTIDINE IN NACL 20-0.9 MG/50ML-% IV SOLN
20.0000 mg | Freq: Two times a day (BID) | INTRAVENOUS | Status: DC
  Administered 2017-01-16 (×2): 20 mg via INTRAVENOUS
  Filled 2017-01-15 (×3): qty 50

## 2017-01-15 MED ORDER — PROPOFOL 1000 MG/100ML IV EMUL
INTRAVENOUS | Status: AC
  Filled 2017-01-15: qty 100

## 2017-01-15 MED ORDER — FENTANYL CITRATE (PF) 100 MCG/2ML IJ SOLN
50.0000 ug | INTRAMUSCULAR | Status: DC | PRN
Start: 2017-01-15 — End: 2017-01-20
  Administered 2017-01-18 – 2017-01-20 (×6): 50 ug via INTRAVENOUS
  Filled 2017-01-15 (×5): qty 2

## 2017-01-15 MED ORDER — VANCOMYCIN HCL IN DEXTROSE 1-5 GM/200ML-% IV SOLN
1000.0000 mg | Freq: Once | INTRAVENOUS | Status: AC
  Administered 2017-01-15: 1000 mg via INTRAVENOUS
  Filled 2017-01-15: qty 200

## 2017-01-15 MED ORDER — ORAL CARE MOUTH RINSE: 15.0000 mL | Freq: Four times a day (QID) | OROMUCOSAL | Status: DC

## 2017-01-15 MED ORDER — FENTANYL CITRATE (PF) 100 MCG/2ML IJ SOLN
INTRAMUSCULAR | Status: AC
  Filled 2017-01-15: qty 2

## 2017-01-15 MED ORDER — IOPAMIDOL (ISOVUE-300) INJECTION 61%
INTRAVENOUS | Status: AC
  Filled 2017-01-15: qty 100

## 2017-01-15 MED ORDER — ONDANSETRON HCL 4 MG/2ML IJ SOLN
4.0000 mg | Freq: Four times a day (QID) | INTRAMUSCULAR | Status: DC | PRN
  Administered 2017-01-19: 4 mg via INTRAVENOUS
  Filled 2017-01-15: qty 2

## 2017-01-15 MED ORDER — FENTANYL CITRATE (PF) 100 MCG/2ML IJ SOLN
50.0000 ug | Freq: Once | INTRAMUSCULAR | Status: AC
  Administered 2017-01-15: 50 ug via INTRAVENOUS
  Filled 2017-01-15: qty 2

## 2017-01-15 MED ORDER — HEPARIN SODIUM (PORCINE) 5000 UNIT/ML IJ SOLN
5000.0000 [IU] | Freq: Three times a day (TID) | INTRAMUSCULAR | Status: DC
  Administered 2017-01-16 – 2017-01-19 (×10): 5000 [IU] via SUBCUTANEOUS
  Filled 2017-01-15 (×12): qty 1

## 2017-01-15 MED ORDER — CALCIUM GLUCONATE 10 % IV SOLN
1.0000 g | Freq: Once | INTRAVENOUS | Status: DC
  Filled 2017-01-15: qty 10

## 2017-01-15 MED ORDER — ASPIRIN 81 MG PO CHEW: 324.0000 mg | CHEWABLE_TABLET | ORAL | Status: AC

## 2017-01-15 MED ORDER — MIDAZOLAM HCL 2 MG/2ML IJ SOLN
2.0000 mg | Freq: Once | INTRAMUSCULAR | Status: AC
  Administered 2017-01-15: 2 mg via INTRAVENOUS

## 2017-01-15 MED ORDER — MIDAZOLAM HCL 2 MG/2ML IJ SOLN
INTRAMUSCULAR | Status: AC
  Filled 2017-01-15: qty 2

## 2017-01-15 MED ORDER — SODIUM CHLORIDE 0.9 % IV SOLN: 250.0000 mL | INTRAVENOUS | Status: DC | PRN

## 2017-01-15 MED ORDER — DOCUSATE SODIUM 50 MG/5ML PO LIQD: 100.0000 mg | Freq: Two times a day (BID) | ORAL | Status: DC | PRN

## 2017-01-15 MED ORDER — EPINEPHRINE PF 1 MG/10ML IJ SOSY
PREFILLED_SYRINGE | INTRAMUSCULAR | Status: AC | PRN
  Administered 2017-01-15: 0.5 mg via INTRAVENOUS

## 2017-01-15 MED ORDER — PIPERACILLIN-TAZOBACTAM IN DEX 2-0.25 GM/50ML IV SOLN
2.2500 g | Freq: Three times a day (TID) | INTRAVENOUS | Status: DC
Start: 2017-01-16 — End: 2017-01-18
  Administered 2017-01-16 – 2017-01-17 (×6): 2.25 g via INTRAVENOUS
  Filled 2017-01-15 (×9): qty 50

## 2017-01-15 MED ORDER — MIDAZOLAM HCL 2 MG/2ML IJ SOLN
2.0000 mg | Freq: Once | INTRAMUSCULAR | Status: AC
  Administered 2017-01-15: 2 mg via INTRAVENOUS
  Filled 2017-01-15: qty 2

## 2017-01-15 MED ORDER — ASPIRIN 300 MG RE SUPP
300.0000 mg | RECTAL | Status: AC
  Administered 2017-01-15: 300 mg via RECTAL
  Filled 2017-01-15: qty 1

## 2017-01-15 MED ORDER — FENTANYL CITRATE (PF) 100 MCG/2ML IJ SOLN
INTRAMUSCULAR | Status: AC
Start: 2017-01-15 — End: 2017-01-16
  Filled 2017-01-15: qty 2

## 2017-01-15 MED ORDER — FENTANYL CITRATE (PF) 100 MCG/2ML IJ SOLN
100.0000 ug | Freq: Once | INTRAMUSCULAR | Status: AC
  Administered 2017-01-15: 100 ug via INTRAVENOUS

## 2017-01-15 MED ORDER — PIPERACILLIN-TAZOBACTAM 3.375 G IVPB 30 MIN
3.3750 g | Freq: Once | INTRAVENOUS | Status: AC
  Administered 2017-01-15: 3.375 g via INTRAVENOUS
  Filled 2017-01-15: qty 50

## 2017-01-15 MED ORDER — ALBUTEROL SULFATE (2.5 MG/3ML) 0.083% IN NEBU: 2.5000 mg | INHALATION_SOLUTION | RESPIRATORY_TRACT | Status: DC | PRN

## 2017-01-15 MED ORDER — DEXTROSE 5 % IV SOLN
0.0000 ug/min | INTRAVENOUS | Status: DC
  Administered 2017-01-15: 2 ug/min via INTRAVENOUS
  Filled 2017-01-15: qty 4

## 2017-01-15 MED ORDER — CHLORHEXIDINE GLUCONATE 0.12% ORAL RINSE (MEDLINE KIT): 15.0000 mL | Freq: Two times a day (BID) | OROMUCOSAL | Status: DC

## 2017-01-15 MED ORDER — EPINEPHRINE PF 1 MG/10ML IJ SOSY
PREFILLED_SYRINGE | INTRAMUSCULAR | Status: DC | PRN
  Administered 2017-01-15 (×2): 0.5 mg via INTRAVENOUS

## 2017-01-15 MED ORDER — FENTANYL CITRATE (PF) 100 MCG/2ML IJ SOLN
50.0000 ug | INTRAMUSCULAR | Status: DC | PRN
  Filled 2017-01-15: qty 2

## 2017-01-15 MED ORDER — MIDAZOLAM HCL 2 MG/2ML IJ SOLN
1.0000 mg | INTRAMUSCULAR | Status: DC | PRN
  Administered 2017-01-16 (×2): 1 mg via INTRAVENOUS
  Filled 2017-01-15 (×2): qty 2

## 2017-01-15 MED ORDER — DEXTROSE 5 % IV SOLN
5.0000 ug/min | INTRAVENOUS | Status: DC
  Administered 2017-01-15: 5 ug/min via INTRAVENOUS
  Administered 2017-01-16: 20 ug/min via INTRAVENOUS
  Administered 2017-01-16: 16 ug/min via INTRAVENOUS
  Filled 2017-01-15 (×3): qty 4

## 2017-01-15 MED ORDER — EPINEPHRINE PF 1 MG/ML IJ SOLN
0.5000 ug/min | INTRAVENOUS | Status: DC
  Administered 2017-01-15: 10 ug/min via INTRAVENOUS
  Administered 2017-01-16: 4 ug/min via INTRAVENOUS
  Filled 2017-01-15 (×2): qty 4

## 2017-01-15 MED ORDER — INSULIN ASPART 100 UNIT/ML ~~LOC~~ SOLN: 2.0000 [IU] | SUBCUTANEOUS | Status: DC

## 2017-01-15 NOTE — ED Notes (Signed)
Dr. Thomasene Lot ( EDP ) notified on elevated lactic acid result.

## 2017-01-15 NOTE — ED Notes (Signed)
Pt intubated. Positive color change.  

## 2017-01-15 NOTE — Progress Notes (Signed)
RT did two arterial punctures for labs per MD request.

## 2017-01-15 NOTE — Progress Notes (Addendum)
Chaplain met family in waiting area and facilitated medical team in communication. Also assisted in escorting family to bedside. Offered emotional support and prayer, per daughter's acceptance.  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  However, patient had been on a ventilator before and told daughter, "I never want to be on that thing again."  She just transferred on medical transport from [place] to be near family during a time of rehabilitation.  Also present are patient's grandchildren, Lytle Michaels and Margarita Grizzle, and Laurie's fiance, [place].  Patient has one other child, an adult son, in [place].  Daughter has been in communication with him.  Offered ongoing support to family.  Please call as needed.  [place], [place]  Addendum [phone]): Daughter plans to tell brother to drive here, which is about 6 hours.  Family expressed understanding that patient may not survive that long.  Also, patient opened her eyes briefly and looked around.  Family was very excited and spoke to her. Chaplain offered prayerful words into patient's ears.  She had already begun to drift off by then.     01/08/2017 1800  Clinical Encounter Type  Visited With Patient and family together  Visit Type Initial;Critical Care  Referral From Nurse  Consult/Referral To Chaplain  Stress Factors  Family Stress Factors Lack of knowledge;Major life changes

## 2017-01-15 NOTE — ED Notes (Signed)
Family at bedside. 

## 2017-01-15 NOTE — ED Triage Notes (Signed)
EMS called for SOB. Pt from [facility]. Pt has been weak. EMS arrived pt alert and oriented. En route pt went unresponsive. Pt received 10 albuterol and 0.5 atrovent from EMS. BP 67/37, HR 62 demand paced. 85% on [facility] when EMS arrived at facility, 93% on nebulizer.

## 2017-01-15 NOTE — ED Notes (Signed)
Unable to collect blood specimen for blood cultures despite several attempts due to poorly visible veins / collapsed veins , EDP notified .

## 2017-01-15 NOTE — ED Notes (Signed)
Notified Dr. Thomasene Lot of main labs concerns about labs being diluted. No redraw at this time per verbal order. Labs were from arterial draw.

## 2017-01-15 NOTE — ED Provider Notes (Signed)
[facility] DEPT Provider Note   CSN: [id number] Arrival date & time: 01/30/2017  1643     History   Chief Complaint Chief Complaint  Patient presents with  . Respiratory Distress    HPI Anna Gray is a 76 y.o. female with past medical history significant for liver cancer, CHF (EF 30), CK D, insulin dependent diabetes, pacemaker and benzodiazepine dependence brought in by EMS for respiratory distress, initially patient was at nursing home, with difficulty breathing, the auscultated wheezing, they gave her albuterol and ipratropium, and Route patient became less conversant, blood pressure dropped to 50 palpation. Level V caveat.   HPI  Past Medical History:  Diagnosis Date  . Anxiety   . CHF (congestive heart failure) ([place])   . Chronic kidney disease   . Depression   . Diabetes mellitus without complication ([place])   . Hepatocellular carcinoma ([place])   . Hypertension   . Presence of permanent cardiac pacemaker     Patient Active Problem List   Diagnosis Date Noted  . Acute respiratory failure with hypoxia ([place]) 01/29/2017  . Benzodiazepine dependence ([place]) 04/22/2016  . Essential tremor 03/23/2016  . Chronic systolic congestive heart failure ([place]) 03/02/2016  . CKD (chronic kidney disease) 03/02/2016  . 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 Malignant neoplasm of liver ([place]) 03/02/2016  . Type 2 diabetes mellitus without complication, with long-term current use of insulin ([place]) 03/02/2016  . Essential hypertension 03/02/2016    Past Surgical History:  Procedure Laterality Date  . embolization     Chemoembolization of HCC  . INSERT / REPLACE / REMOVE PACEMAKER      OB History    No data available       Home Medications    Prior to Admission medications   Medication Sig Start Date End Date Taking? Authorizing Provider  acetaminophen (TYLENOL) 325 MG tablet Take 650 mg by mouth every 8 (eight) hours as needed.   Yes [provider]  bumetanide (BUMEX) 0.5 MG tablet Take 0.5 mg by mouth daily.   Yes [provider]  carvedilol (COREG) 12.5 MG tablet Take 12.5 mg by mouth 2 (two) times daily with a meal.   Yes [provider]  Cholecalciferol (VITAMIN D3) 50000 units CAPS Take 1 capsule by mouth once a week.   Yes [provider]  cyproheptadine (PERIACTIN) 4 MG tablet Take 4 mg by mouth daily.   Yes [provider]  docusate sodium (COLACE) 100 MG capsule Take 100 mg by mouth 3 (three) times daily as needed for mild constipation.   Yes [provider]  enoxaparin (LOVENOX) 40 MG/0.4ML injection Inject 40 mg into the skin daily.   Yes [provider]  esomeprazole (NEXIUM) 40 MG capsule Take 40 mg by mouth daily at 12 noon.   Yes [provider]  hydrALAZINE (APRESOLINE) 25 MG tablet TAKE 1 TABLET BY MOUTH THREE TIMES DAILY 07/04/16  Yes Rumley, Creighton N, DO  HYDROmorphone (DILAUDID) 2 MG tablet Take 1 tablet (2 mg total) by mouth every 8 (eight) hours as needed for severe pain. Patient taking differently: Take 4 mg by mouth every 6 (six) hours as needed for severe pain.  05/26/16  Yes Rumley, Kermit N, DO  isosorbide mononitrate (IMDUR) 30 MG 24 hr tablet Take 1 tablet (30 mg total) by mouth daily. Patient taking differently: Take 60 mg by mouth daily.  04/22/16  Yes Eloise Levels, MD  mirtazapine (REMERON) 15 MG tablet Take 15  mg by mouth at bedtime.   Yes [provider]  NIFEdipine (PROCARDIA XL/ADALAT-CC) 90 MG 24 hr tablet Take 1 tablet (90 mg total) by mouth daily. 04/22/16  Yes Eloise Levels, MD  senna (SENOKOT) 8.6 MG TABS tablet Take 2 tablets by mouth at bedtime.   Yes [provider]  aspirin EC 81 MG tablet Take 1 tablet (81 mg total) by mouth daily. Patient not taking: Reported on 01/05/2017 03/02/16   Lorna Few, DO  furosemide (LASIX) 80 MG tablet Take 1 tablet (80 mg total) by mouth 2 (two) times daily. Patient not  taking: Reported on 01/13/2017 04/22/16   Eloise Levels, MD  INS SYRINGE/NEEDLE 1CC/28G (B-D INSULIN SYRINGE 1CC/28G) 28G X 1/2" 1 ML MISC 10 Units by Does not apply route daily at 10 pm. Patient not taking: Reported on 01/10/2017 04/23/16   Eloise Levels, MD  Insulin Glargine (LANTUS) 100 UNIT/ML Solostar Pen Inject 10 Units into the skin daily at 10 pm. Patient not taking: Reported on 02/03/2017 04/22/16   Eloise Levels, MD  LORazepam (ATIVAN) 0.5 MG tablet Take one half tablet daily for one week and then stop. Patient not taking: Reported on 01/04/2017 04/22/16   Eloise Levels, MD  metoCLOPramide (REGLAN) 5 MG/5ML solution Take 2.5 mLs (2.5 mg total) by mouth 4 (four) times daily -  before meals and at bedtime. Patient not taking: Reported on 04/22/2016 03/22/16   Lorna Few, DO  metoprolol succinate (TOPROL-XL) 50 MG 24 hr tablet Take 3 tablets (150 mg total) by mouth daily. Take with or immediately following a meal. Patient not taking: Reported on 01/10/2017 04/22/16   Eloise Levels, MD  pantoprazole (PROTONIX) 40 MG tablet Take 1 tablet (40 mg total) by mouth daily. Patient not taking: Reported on 01/20/2017 03/21/16   Lorna Few, DO  potassium chloride SA (K-DUR,KLOR-CON) 20 MEQ tablet Take 2 tablets (40 mEq total) by mouth daily. Patient not taking: Reported on 01/13/2017 04/22/16   Eloise Levels, MD  traZODone (DESYREL) 50 MG tablet TAKE 1 TABLET BY MOUTH EVERY NIGHT AT BEDTIME AS NEEDED FOR SLEEP Patient not taking: Reported on 01/17/2017 06/09/16   Lorna Few, DO    Family History Family History  Problem Relation Age of Onset  . Other Mother        Car accident  . Hypertension Mother   . Other Father        Car accident  . Hypertension Sister     Social History Social History  Substance Use Topics  . Smoking status: Former Smoker    Packs/day: 0.50    Years: 25.00    Types: Cigarettes    Quit date: 12/11/2015  . Smokeless tobacco: Never Used      Comment: hasn't smoked in past 4 months  . Alcohol use No     Allergies   Vicodin [hydrocodone-acetaminophen]   Review of Systems Review of Systems  Unable to perform ROS: Acuity of condition     Physical Exam Updated Vital Signs BP (!) 95/56   Pulse (!) 59   Temp (!) 96.8 F (36 C) (Tympanic)   Resp 16   Ht 5' 2.5" (1.588 m)   Wt 49.8 kg (109 lb 12.6 oz)   SpO2 100%   BMI 19.76 kg/m   Physical Exam  Constitutional:  Frail and thin  HENT:  Head: Normocephalic.  Mouth/Throat: Oropharynx is clear and moist.  Eyes: Pupils are equal, round, and  reactive to light.  Cardiovascular: Normal rate.   Carotid pulse palpable, irregular  Abdominal: She exhibits no distension and no mass. There is no tenderness. There is no rebound and no guarding. No hernia.  Neurological:  Somnolent, nonresponsive, does not follow commands  Skin: Capillary refill takes less than 2 seconds.  Nursing note reviewed.    ED Treatments / Results  Labs (all labs ordered are listed, but only abnormal results are displayed) Labs Reviewed  CBC WITH DIFFERENTIAL/PLATELET - Abnormal; Notable for the following:       Result Value   WBC 15.4 (*)    RBC 3.17 (*)    Hemoglobin 7.8 (*)    HCT 23.3 (*)    MCV 73.5 (*)    MCH 24.6 (*)    RDW 18.5 (*)    Platelets 430 (*)    Neutro Abs 11.5 (*)    All other components within normal limits  PROTIME-INR - Abnormal; Notable for the following:    Prothrombin Time 15.8 (*)    All other components within normal limits  BRAIN NATRIURETIC PEPTIDE - Abnormal; Notable for the following:    B Natriuretic Peptide 1,862.2 (*)    All other components within normal limits  COMPREHENSIVE METABOLIC PANEL - Abnormal; Notable for the following:    Sodium 134 (*)    Potassium 6.2 (*)    CO2 13 (*)    Glucose, Bld 442 (*)    BUN 72 (*)    Creatinine, Ser 3.83 (*)    Calcium 8.1 (*)    Total Protein 6.2 (*)    Albumin 2.3 (*)    AST 123 (*)    Alkaline  Phosphatase 138 (*)    GFR calc non Af Amer 11 (*)    GFR calc Af Amer 12 (*)    All other components within normal limits  I-STAT CG4 LACTIC ACID, ED - Abnormal; Notable for the following:    Lactic Acid, Venous 7.50 (*)    All other components within normal limits  CBG MONITORING, ED - Abnormal; Notable for the following:    Glucose-Capillary 414 (*)    All other components within normal limits  I-STAT VENOUS BLOOD GAS, ED - Abnormal; Notable for the following:    pH, Ven 7.203 (*)    pCO2, Ven 26.0 (*)    pO2, Ven 114.0 (*)    Bicarbonate 10.2 (*)    Acid-base deficit 16.0 (*)    All other components within normal limits  I-STAT CHEM 8, ED - Abnormal; Notable for the following:    Potassium 6.6 (*)    BUN 66 (*)    Creatinine, Ser 3.60 (*)    Glucose, Bld 487 (*)    Calcium, Ion 1.11 (*)    Hemoglobin 7.8 (*)    HCT 23.0 (*)    All other components within normal limits  I-STAT CG4 LACTIC ACID, ED - Abnormal; Notable for the following:    Lactic Acid, Venous 3.27 (*)    All other components within normal limits  I-STAT ARTERIAL BLOOD GAS, ED - Abnormal; Notable for the following:    pH, Arterial 7.551 (*)    pCO2 arterial <15.0 (*)    Bicarbonate 13.3 (*)    Acid-base deficit 13.0 (*)    All other components within normal limits  CBG MONITORING, ED - Abnormal; Notable for the following:    Glucose-Capillary 461 (*)    All other components within normal limits  CULTURE, BLOOD (ROUTINE  X 2)  CULTURE, BLOOD (ROUTINE X 2)  CULTURE, BLOOD (ROUTINE X 2)  CULTURE, BLOOD (ROUTINE X 2)  CULTURE, EXPECTORATED SPUTUM-ASSESSMENT  APTT  LACTIC ACID, PLASMA  LACTIC ACID, PLASMA  URINALYSIS, ROUTINE W REFLEX MICROSCOPIC  CORTISOL  PROCALCITONIN  BASIC METABOLIC PANEL  LACTIC ACID, PLASMA  LACTIC ACID, PLASMA  TROPONIN I  TROPONIN I  TROPONIN I  CORTISOL  URINALYSIS, ROUTINE W REFLEX MICROSCOPIC  BLOOD GAS, ARTERIAL  CBC  BLOOD GAS, ARTERIAL  MAGNESIUM  PHOSPHORUS    COMPREHENSIVE METABOLIC PANEL  I-STAT TROPONIN, ED    EKG  EKG Interpretation None       Radiology Ct Abdomen Pelvis Wo Contrast  Result Date: 01/13/2017 CLINICAL DATA:  Acute respiratory illness. Hepatocellular carcinoma. Chronic kidney disease. Lung metastases. Recently transferred from [place]. EXAM: CT CHEST, ABDOMEN AND PELVIS WITHOUT CONTRAST TECHNIQUE: Multidetector CT imaging of the chest, abdomen and pelvis was performed following the standard protocol without IV contrast. COMPARISON:  Portable chest obtained earlier today. FINDINGS: CT CHEST FINDINGS Cardiovascular: Enlarged heart. Left subclavian pacemaker leads. Atheromatous arterial calcifications, including the aorta and coronary arteries. No pericardial fluid. Mediastinum/Nodes: Orogastric tube extending into the stomach. Endotracheal tube in satisfactory position. Multiple enlarged mediastinal nodes. These include a precarinal node with a short axis diameter of 23 mm on image number 25 of series 7. Lungs/Pleura: Multiple bilateral lung nodules. The largest is in the left upper lobe, measuring 1.8 cm in maximum diameter on image number 14 of series 8. Moderate-sized right pleural effusion. Small left pleural effusion. The pleural fluid on the right includes loculated fluid superiorly in the medial aspect of the hemithorax. There is also right upper lobe and right lower lobe atelectasis. Mild left lower lobe atelectasis. Musculoskeletal: Thoracic and lower cervical spine degenerative changes. No evidence of bony metastatic disease. CT ABDOMEN PELVIS FINDINGS Hepatobiliary: Large area of mildly decreased density in the posterior segment of the right lobe of the liver, measuring approximately 7.2 x 5.7 cm on image number 67 of series 7. The remainder of the liver is mildly heterogeneous. No well-defined liver masses are visualized. The gallbladder is not visualized. There is some air in the biliary tree. Pancreas: Not well visualized,  grossly unremarkable with with the exception of air in the pancreatic duct. Spleen: Small and oval in configuration. Adrenals/Urinary Tract: Poorly defined adrenal glands, grossly unremarkable. Small kidneys. Unremarkable urinary bladder. No visible urinary tract calculi or hydronephrosis. Stomach/Bowel: Orogastric tube tip in the distal stomach. The stomach is dilated. Unremarkable small bowel, colon and appendix. Vascular/Lymphatic: Atheromatous arterial calcifications without aneurysm. Mildly enlarged retrocrural lymph nodes. These include a 10 mm short axis node on image number 56 of series 7. Mildly enlarged retroperitoneal lymph nodes, including a left para-aortic node with a short axis diameter of 8 mm on image number 62 of series 7. Reproductive: Calcified uterine fibroids. Large left ovarian cyst, measuring 8.2 cm in maximum diameter. No visible soft tissue components. Other: Diffuse subcutaneous edema. Musculoskeletal: Mild lumbar spine degenerative changes. No evidence of bony metastatic disease. IMPRESSION: 1. Multiple lung metastases. 2. Moderate-sized right pleural effusion. This includes loculated pleural fluid superiorly, possibly due to pleural metastatic disease. 3. Small left pleural effusion. 4. Bilateral atelectasis, greater on the right. 5. Metastatic mediastinal adenopathy. 6. Approximately 7.2 x 5.7 cm ill-defined area of low density in the posterior segment of the right lobe of the liver. This could represent the patient's known hepatocellular carcinoma, metastasis or geographical area of steatosis. 7. Minimal retrocrural and  retroperitoneal adenopathy. This could be metastatic or reactive. 8. 8.2 cm left ovarian cyst. At this age, this is concerning for a possible ovarian neoplasm. Electronically Signed   By: Claudie Revering M.D.   On: 01/20/2017 19:19   Dg Chest 1 View  Result Date: 01/31/2017 CLINICAL DATA:  Initial evaluation for intubation. EXAM: CHEST 1 VIEW COMPARISON:  Prior  radiograph from 03/12/2016. FINDINGS: Endotracheal tube in place with tip positioned 3.7 cm above the carina. Enteric tube courses in the the abdomen. Left-sided pacemaker/ AICD. Defibrillator pad overlies left chest. Stable cardiomegaly. Mediastinal silhouette within normal limits. Aortic atherosclerosis. Large left pleural effusion. Diffuse vascular congestion with interstitial prominence, right greater than left, suggesting pulmonary edema. No definite focal infiltrates. No appreciable pneumothorax on this supine projection. No acute osseus abnormality. IMPRESSION: 1. Tip of the endotracheal tube 3.7 cm above the carina. 2. Cardiomegaly with pulmonary interstitial edema and large right pleural effusion. Electronically Signed   By: Jeannine Boga M.D.   On: 01/26/2017 17:45   Ct Head Wo Contrast  Result Date: 02/01/2017 CLINICAL DATA:  Altered mental status.  Hepatocellular carcinoma. EXAM: CT HEAD WITHOUT CONTRAST TECHNIQUE: Contiguous axial images were obtained from the base of the skull through the vertex without intravenous contrast. COMPARISON:  None. FINDINGS: Brain: There is a high density mass in the posterior right frontal lobe extending through eroded bone at the skull vertex into the scalp soft tissues. In the coronal plane, this mass extends across midline and measures 4.3 x 2.7 cm on image number 34. This extends into the posterior right frontal lobe with adjacent white matter low density. No significant mass effect. Also demonstrated is an oval high density mass in the left cerebellar hemisphere, measuring 2.2 x 1.5 cm on image 5 of series 3 without adjacent bone involvement. Also demonstrated is a similar-appearing mass in the right cerebellar hemisphere, measuring 0.9 x 0.8 cm on image number 4 of series 3. This also does not involve nearby bone. Diffusely enlarged ventricles and subarachnoid spaces. Mild patchy white matter low density in both cerebral hemispheres. No intracranial  hemorrhage. Vascular: The mass in the superior aspect of the posterior right frontal lobe and extending through the skull vertex is most likely invading and obstructing the superior sagittal sinus. Skull: Mass eroding through the skull vertex, as described above. Sinuses/Orbits: No acute finding. Other: Endotracheal and orogastric tubes in place. IMPRESSION: 1. Large metastasis in the superior aspect of the posterior right frontal lobe extending through the skull vertex, across midline and into the overlying scalp soft tissues. There is associated vasogenic edema in the adjacent right frontal lobe white matter. This would be amenable to percutaneous biopsy. 2. Additional metastases in the right and left cerebellar hemispheres. 3. No intracranial hemorrhage or mass effect. 4. Mild to moderate diffuse cerebral and cerebellar atrophy and mild chronic small vessel white matter ischemic changes in both cerebral hemispheres. Electronically Signed   By: Claudie Revering M.D.   On: 01/30/2017 19:02   Ct Chest Wo Contrast  Result Date: 01/28/2017 CLINICAL DATA:  Acute respiratory illness. Hepatocellular carcinoma. Chronic kidney disease. Lung metastases. Recently transferred from [place]. EXAM: CT CHEST, ABDOMEN AND PELVIS WITHOUT CONTRAST TECHNIQUE: Multidetector CT imaging of the chest, abdomen and pelvis was performed following the standard protocol without IV contrast. COMPARISON:  Portable chest obtained earlier today. FINDINGS: CT CHEST FINDINGS Cardiovascular: Enlarged heart. Left subclavian pacemaker leads. Atheromatous arterial calcifications, including the aorta and coronary arteries. No pericardial fluid. Mediastinum/Nodes: Orogastric tube extending  into the stomach. Endotracheal tube in satisfactory position. Multiple enlarged mediastinal nodes. These include a precarinal node with a short axis diameter of 23 mm on image number 25 of series 7. Lungs/Pleura: Multiple bilateral lung nodules. The largest is in the  left upper lobe, measuring 1.8 cm in maximum diameter on image number 14 of series 8. Moderate-sized right pleural effusion. Small left pleural effusion. The pleural fluid on the right includes loculated fluid superiorly in the medial aspect of the hemithorax. There is also right upper lobe and right lower lobe atelectasis. Mild left lower lobe atelectasis. Musculoskeletal: Thoracic and lower cervical spine degenerative changes. No evidence of bony metastatic disease. CT ABDOMEN PELVIS FINDINGS Hepatobiliary: Large area of mildly decreased density in the posterior segment of the right lobe of the liver, measuring approximately 7.2 x 5.7 cm on image number 67 of series 7. The remainder of the liver is mildly heterogeneous. No well-defined liver masses are visualized. The gallbladder is not visualized. There is some air in the biliary tree. Pancreas: Not well visualized, grossly unremarkable with with the exception of air in the pancreatic duct. Spleen: Small and oval in configuration. Adrenals/Urinary Tract: Poorly defined adrenal glands, grossly unremarkable. Small kidneys. Unremarkable urinary bladder. No visible urinary tract calculi or hydronephrosis. Stomach/Bowel: Orogastric tube tip in the distal stomach. The stomach is dilated. Unremarkable small bowel, colon and appendix. Vascular/Lymphatic: Atheromatous arterial calcifications without aneurysm. Mildly enlarged retrocrural lymph nodes. These include a 10 mm short axis node on image number 56 of series 7. Mildly enlarged retroperitoneal lymph nodes, including a left para-aortic node with a short axis diameter of 8 mm on image number 62 of series 7. Reproductive: Calcified uterine fibroids. Large left ovarian cyst, measuring 8.2 cm in maximum diameter. No visible soft tissue components. Other: Diffuse subcutaneous edema. Musculoskeletal: Mild lumbar spine degenerative changes. No evidence of bony metastatic disease. IMPRESSION: 1. Multiple lung metastases. 2.  Moderate-sized right pleural effusion. This includes loculated pleural fluid superiorly, possibly due to pleural metastatic disease. 3. Small left pleural effusion. 4. Bilateral atelectasis, greater on the right. 5. Metastatic mediastinal adenopathy. 6. Approximately 7.2 x 5.7 cm ill-defined area of low density in the posterior segment of the right lobe of the liver. This could represent the patient's known hepatocellular carcinoma, metastasis or geographical area of steatosis. 7. Minimal retrocrural and retroperitoneal adenopathy. This could be metastatic or reactive. 8. 8.2 cm left ovarian cyst. At this age, this is concerning for a possible ovarian neoplasm. Electronically Signed   By: Claudie Revering M.D.   On: 01/22/2017 19:19   Dg Chest Portable 1 View  Result Date: 01/09/2017 CLINICAL DATA:  ETT placement EXAM: PORTABLE CHEST 1 VIEW COMPARISON:  01/29/2017 FINDINGS: Endotracheal tube tip is about 2.5 cm superior to the carina. Left-sided pacing device similar compared to prior. Moderate right pleural effusion or thickening has slightly increased. Diffuse hazy opacity in the right thorax likely due to layering effusion and underlying atelectasis or infiltrate. Cardiomegaly with atherosclerosis. IMPRESSION: 1. Endotracheal tube tip about 2.5 cm superior to carina 2. Cardiomegaly. 3. Increased large right-sided pleural effusion with diffuse hazy opacity in the right thorax which may be due to combination of layering fluid and underlying parenchymal disease. Electronically Signed   By: Donavan Foil M.D.   On: 01/12/2017 23:48    Procedures Procedure Name: Intubation Date/Time: 01/14/2017 7:48 PM Performed by: Monico Blitz Pre-anesthesia Checklist: Patient identified Oxygen Delivery Method: Non-rebreather mask Induction Type: Cricoid Pressure applied Ventilation: Mask ventilation without difficulty  Laryngoscope Size: Mac Tube size: 7.5 mm Number of attempts: 1 Placement Confirmation: ETT  inserted through vocal cords under direct vision,  Positive ETCO2,  CO2 detector and Breath sounds checked- equal and bilateral Tube secured with: ETT holder      (including critical care time)  CRITICAL CARE Performed by: Monico Blitz   Total critical care time: 60 minutes  Critical care time was exclusive of separately billable procedures and treating other patients.  Critical care was necessary to treat or prevent imminent or life-threatening deterioration.  Critical care was time spent personally by me on the following activities: development of treatment plan with patient and/or surrogate as well as nursing, discussions with consultants, evaluation of patient's response to treatment, examination of patient, obtaining history from patient or surrogate, ordering and performing treatments and interventions, ordering and review of laboratory studies, ordering and review of radiographic studies, pulse oximetry and re-evaluation of patient's condition.   Medications Ordered in ED Medications  fentaNYL (SUBLIMAZE) 100 MCG/2ML injection (not administered)  EPINEPHrine (ADRENALIN) 1 MG/10ML injection (0.5 mg Intravenous Given 01/26/2017 1815)  fentaNYL (SUBLIMAZE) 100 MCG/2ML injection (not administered)  midazolam (VERSED) 2 MG/2ML injection (not administered)  fentaNYL (SUBLIMAZE) 100 MCG/2ML injection (not administered)  midazolam (VERSED) 2 MG/2ML injection (not administered)  EPINEPHrine (ADRENALIN) 4 mg in dextrose 5 % 250 mL (0.016 mg/mL) infusion (8 mcg/min Intravenous Rate/Dose Change 01/14/2017 1915)  calcium gluconate 1 g in sodium chloride 0.9 % 100 mL IVPB (1 g Intravenous Not Given 01/21/2017 1852)  fentaNYL (SUBLIMAZE) 100 MCG/2ML injection (not administered)  midazolam (VERSED) 2 MG/2ML injection (not administered)  fentaNYL 2548mg in NS 2575m(101mml) infusion-PREMIX (150 mcg/hr Intravenous Rate/Dose Change 01/16/17 0010)  0.9 %  sodium chloride infusion (not  administered)  heparin injection 5,000 Units (not administered)  famotidine (PEPCID) IVPB 20 mg premix (not administered)  acetaminophen (TYLENOL) tablet 650 mg (not administered)  ondansetron (ZOFRAN) injection 4 mg (not administered)  albuterol (PROVENTIL) (2.5 MG/3ML) 0.083% nebulizer solution 2.5 mg (not administered)  chlorhexidine gluconate (MEDLINE KIT) (PERIDEX) 0.12 % solution 15 mL (not administered)  MEDLINE mouth rinse (not administered)  vancomycin (VANCOCIN) IVPB 1000 mg/200 mL premix (1,000 mg Intravenous New Bag/Given 01/28/2017 2345)  norepinephrine (LEVOPHED) 4 mg in dextrose 5 % 250 mL (0.016 mg/mL) infusion (15 mcg/min Intravenous Rate/Dose Change 01/16/17 0011)  insulin aspart (novoLOG) injection 2-6 Units (not administered)  fentaNYL (SUBLIMAZE) injection 50 mcg (not administered)  fentaNYL (SUBLIMAZE) injection 50 mcg (not administered)  midazolam (VERSED) injection 1 mg (1 mg Intravenous Given 02/02/2017 2318)  midazolam (VERSED) injection 1 mg (not administered)  docusate (COLACE) 50 MG/5ML liquid 100 mg (not administered)  piperacillin-tazobactam (ZOSYN) IVPB 2.25 g (0 g Intravenous Stopped 01/16/2017 2343)  midazolam (VERSED) 2 MG/2ML injection (not administered)  fentaNYL (SUBLIMAZE) injection 100 mcg (100 mcg Intravenous Given 01/22/2017 1701)  EPINEPHrine (ADRENALIN) 1 MG/10ML injection (0.5 mg Intravenous Given 01/12/2017 1713)  fentaNYL (SUBLIMAZE) injection 100 mcg (100 mcg Intravenous Given 01/24/2017 1730)  midazolam (VERSED) injection 2 mg (2 mg Intravenous Given 01/12/2017 1730)  fentaNYL (SUBLIMAZE) injection 100 mcg (100 mcg Intravenous Given 01/04/2017 1750)  midazolam (VERSED) injection 2 mg (2 mg Intravenous Given 01/22/2017 1750)  midazolam (VERSED) injection 2 mg (2 mg Intravenous Given 02/02/2017 1916)  fentaNYL (SUBLIMAZE) injection 50 mcg (50 mcg Intravenous Given 01/22/2017 1915)  fentaNYL (SUBLIMAZE) injection 50 mcg (50 mcg Intravenous Given 01/04/2017 2033)  midazolam  (VERSED) injection 2 mg (2 mg Intravenous Given 01/25/2017 2108)  fentaNYL (SUBLIMAZE) injection 50  mcg (50 mcg Intravenous Given 01/30/2017 2109)  aspirin chewable tablet 324 mg ( Oral See Alternative 01/07/2017 2318)    Or  aspirin suppository 300 mg (300 mg Rectal Given 01/27/2017 2318)  piperacillin-tazobactam (ZOSYN) IVPB 3.375 g (3.375 g Intravenous New Bag/Given 01/20/2017 2318)  insulin aspart (novoLOG) injection 10 Units (10 Units Subcutaneous Given 01/16/17 0007)     Initial Impression / Assessment and Plan / ED Course  I have reviewed the triage vital signs and the nursing notes.  Pertinent labs & imaging results that were available during my care of the patient were reviewed by me and considered in my medical decision making (see chart for details).     Vitals:   01/11/2017 2230 01/19/2017 2245 01/19/2017 2300 01/30/2017 2318  BP: (!) 91/56 (!) 93/57 (!) 95/56   Pulse: 60 (!) 59 (!) 59   Resp: _0 Temp:      TempSrc:      SpO2: 100% 100% 100% 100%  Weight:   49.8 kg (109 lb 12.6 oz)   Height:   5' 2.5" (1.588 m)     Medications  fentaNYL (SUBLIMAZE) 100 MCG/2ML injection (not administered)  EPINEPHrine (ADRENALIN) 1 MG/10ML injection (0.5 mg Intravenous Given 01/16/2017 1815)  fentaNYL (SUBLIMAZE) 100 MCG/2ML injection (not administered)  midazolam (VERSED) 2 MG/2ML injection (not administered)  fentaNYL (SUBLIMAZE) 100 MCG/2ML injection (not administered)  midazolam (VERSED) 2 MG/2ML injection (not administered)  EPINEPHrine (ADRENALIN) 4 mg in dextrose 5 % 250 mL (0.016 mg/mL) infusion (8 mcg/min Intravenous Rate/Dose Change 01/06/2017 1915)  calcium gluconate 1 g in sodium chloride 0.9 % 100 mL IVPB (1 g Intravenous Not Given 02/02/2017 1852)  fentaNYL (SUBLIMAZE) 100 MCG/2ML injection (not administered)  midazolam (VERSED) 2 MG/2ML injection (not administered)  fentaNYL 2547mg in NS 2534m(1065mml) infusion-PREMIX (150 mcg/hr Intravenous Rate/Dose Change 01/16/17 0010)  0.9 %  sodium  chloride infusion (not administered)  heparin injection 5,000 Units (not administered)  famotidine (PEPCID) IVPB 20 mg premix (not administered)  acetaminophen (TYLENOL) tablet 650 mg (not administered)  ondansetron (ZOFRAN) injection 4 mg (not administered)  albuterol (PROVENTIL) (2.5 MG/3ML) 0.083% nebulizer solution 2.5 mg (not administered)  chlorhexidine gluconate (MEDLINE KIT) (PERIDEX) 0.12 % solution 15 mL (not administered)  MEDLINE mouth rinse (not administered)  vancomycin (VANCOCIN) IVPB 1000 mg/200 mL premix (1,000 mg Intravenous New Bag/Given 01/26/2017 2345)  norepinephrine (LEVOPHED) 4 mg in dextrose 5 % 250 mL (0.016 mg/mL) infusion (15 mcg/min Intravenous Rate/Dose Change 01/16/17 0011)  insulin aspart (novoLOG) injection 2-6 Units (not administered)  fentaNYL (SUBLIMAZE) injection 50 mcg (not administered)  fentaNYL (SUBLIMAZE) injection 50 mcg (not administered)  midazolam (VERSED) injection 1 mg (1 mg Intravenous Given 01/05/2017 2318)  midazolam (VERSED) injection 1 mg (not administered)  docusate (COLACE) 50 MG/5ML liquid 100 mg (not administered)  piperacillin-tazobactam (ZOSYN) IVPB 2.25 g (0 g Intravenous Stopped 01/25/2017 2343)  midazolam (VERSED) 2 MG/2ML injection (not administered)  fentaNYL (SUBLIMAZE) injection 100 mcg (100 mcg Intravenous Given 01/16/2017 1701)  EPINEPHrine (ADRENALIN) 1 MG/10ML injection (0.5 mg Intravenous Given 01/22/2017 1713)  fentaNYL (SUBLIMAZE) injection 100 mcg (100 mcg Intravenous Given 01/05/2017 1730)  midazolam (VERSED) injection 2 mg (2 mg Intravenous Given 01/19/2017 1730)  fentaNYL (SUBLIMAZE) injection 100 mcg (100 mcg Intravenous Given 01/30/2017 1750)  midazolam (VERSED) injection 2 mg (2 mg Intravenous Given 01/14/2017 1750)  midazolam (VERSED) injection 2 mg (2 mg Intravenous Given 01/08/2017 1916)  fentaNYL (SUBLIMAZE) injection 50 mcg (50 mcg Intravenous Given 01/04/2017 1915)  fentaNYL (SUBLIMAZE) injection 50 mcg (50 mcg Intravenous Given 01/06/2017  2033)  midazolam (VERSED) injection 2 mg (2 mg Intravenous Given 01/29/2017 2108)  fentaNYL (SUBLIMAZE) injection 50 mcg (50 mcg Intravenous Given 01/16/2017 2109)  aspirin chewable tablet 324 mg ( Oral See Alternative 01/13/2017 2318)    Or  aspirin suppository 300 mg (300 mg Rectal Given 01/10/2017 2318)  piperacillin-tazobactam (ZOSYN) IVPB 3.375 g (3.375 g Intravenous New Bag/Given 01/14/2017 2318)  insulin aspart (novoLOG) injection 10 Units (10 Units Subcutaneous Given 01/16/17 0007)    Anna Gray is 76 y.o. female presenting with Respiratory distress, decreasing mental status, hypotension. Patient intubated, EKG without acute findings, troponin negative, chest x-ray with a white out on the right side. Patient's blood pressure is difficult to maintain, we decided drip increased to Levophed drip to 20 mics with additional push dose epinephrine periodically. Head CT with metastases, chest CT with multiple metastases and loculated pleural effusion on the right.  Patient afebrile, patient was intubated before family members arrive his stated that she is a DNR/DNI. Family members would like to maintain but not escalate care. Pressor change to epinephrine, family would like to try to maintain care so that brother can fly in from [place]. Extensive discussion with family on findings, options and care plan. They've chosen not to escalate care but not to extubate. Declining central line, decline extubation. We will administer sedation to keep her comfortable while she is intubated. Blood pressure remains soft with systolic in the mid 12X.  Attending has Discussed case with intensivist will come to evaluate the patient.  Final Clinical Impressions(s) / ED Diagnoses   Final diagnoses:  Respiratory distress  Brain metastases ([place])  Malignant neoplasm metastatic to lung, unspecified laterality [facility])    New Prescriptions New Prescriptions   No medications on file     Waynetta Pean 01/16/17 0012      Kianah Harries, Elmyra Ricks, PA-C 01/16/17 0027    Macarthur Critchley, MD 01/27/17 [phone]

## 2017-01-15 NOTE — Progress Notes (Signed)
Pharmacy Antibiotic Note  Anna Gray is a 76 y.o. female admitted on 01/22/2017 with sepsis.  Pharmacy has been consulted for Vancocin and Zosyn dosing.  Plan: Pt rec'ing vanc 1g and Zosyn 3.375g in ED. Will hold off on further vanc doses given renal function. Zosyn 2.25g IV Q8H.  Height: 5' 2.5" (158.8 cm) Weight: 109 lb 12.6 oz (49.8 kg) IBW/kg (Calculated) : 51.25  Temp (24hrs), Avg:96.8 F (36 C), Min:96.8 F (36 C), Max:96.8 F (36 C)   Recent Labs Lab 01/30/2017 1705 01/11/2017 1721 01/13/2017 1744 02/03/2017 2145 01/04/2017 2151  WBC 15.4*  --   --   --   --   CREATININE  --   --  3.60* 3.83*  --   LATICACIDVEN  --  7.50*  --   --  3.27*    Estimated Creatinine Clearance: 9.8 mL/min (A) (by C-G formula based on SCr of 3.83 mg/dL (H)).    Allergies  Allergen Reactions  . Vicodin [Hydrocodone-Acetaminophen] Nausea And Vomiting    Thank you for allowing pharmacy to be a part of this patient's care.  Wynona Neat, PharmD, BCPS  01/21/2017 11:12 PM

## 2017-01-16 ENCOUNTER — Other Ambulatory Visit: Payer: Self-pay

## 2017-01-16 ENCOUNTER — Inpatient Hospital Stay (HOSPITAL_COMMUNITY): Payer: Medicare Other

## 2017-01-16 DIAGNOSIS — C7931 Secondary malignant neoplasm of brain: Secondary | ICD-10-CM

## 2017-01-16 DIAGNOSIS — Z4659 Encounter for fitting and adjustment of other gastrointestinal appliance and device: Secondary | ICD-10-CM

## 2017-01-16 DIAGNOSIS — I351 Nonrheumatic aortic (valve) insufficiency: Secondary | ICD-10-CM

## 2017-01-16 DIAGNOSIS — I340 Nonrheumatic mitral (valve) insufficiency: Secondary | ICD-10-CM

## 2017-01-16 DIAGNOSIS — I361 Nonrheumatic tricuspid (valve) insufficiency: Secondary | ICD-10-CM

## 2017-01-16 DIAGNOSIS — R0603 Acute respiratory distress: Secondary | ICD-10-CM

## 2017-01-16 LAB — GLUCOSE, CAPILLARY
GLUCOSE-CAPILLARY: 157 mg/dL — AB (ref 65–99)
GLUCOSE-CAPILLARY: 113 mg/dL — AB (ref 65–99)
GLUCOSE-CAPILLARY: 89 mg/dL (ref 65–99)
Glucose-Capillary: 337 mg/dL — ABNORMAL HIGH (ref 65–99)
Glucose-Capillary: 47 mg/dL — ABNORMAL LOW (ref 65–99)
Glucose-Capillary: 84 mg/dL (ref 65–99)
Glucose-Capillary: 79 mg/dL (ref 65–99)

## 2017-01-16 LAB — CBC
HCT: 22.7 % — ABNORMAL LOW (ref 36.0–46.0)
Hemoglobin: 7.8 g/dL — ABNORMAL LOW (ref 12.0–15.0)
MCH: 24.5 pg — ABNORMAL LOW (ref 26.0–34.0)
MCHC: 34.4 g/dL (ref 30.0–36.0)
MCV: 71.2 fL — ABNORMAL LOW (ref 78.0–100.0)
Platelets: 462 K/uL — ABNORMAL HIGH (ref 150–400)
RBC: 3.19 MIL/uL — ABNORMAL LOW (ref 3.87–5.11)
RDW: 18.2 % — ABNORMAL HIGH (ref 11.5–15.5)
WBC: 23 K/uL — ABNORMAL HIGH (ref 4.0–10.5)

## 2017-01-16 LAB — URINALYSIS, ROUTINE W REFLEX MICROSCOPIC
Bilirubin Urine: NEGATIVE
Glucose, UA: NEGATIVE mg/dL
Hgb urine dipstick: NEGATIVE
Ketones, ur: NEGATIVE mg/dL
Leukocytes, UA: NEGATIVE
Nitrite: NEGATIVE
Protein, ur: 100 mg/dL — AB
Specific Gravity, Urine: 1.014 (ref 1.005–1.030)
pH: 5 (ref 5.0–8.0)

## 2017-01-16 LAB — BLOOD GAS, ARTERIAL
Acid-base deficit: 11.4 mmol/L — ABNORMAL HIGH (ref 0.0–2.0)
Bicarbonate: 15.2 mmol/L — ABNORMAL LOW (ref 20.0–28.0)
FIO2: 100
LHR: 16 {breaths}/min
O2 Saturation: 98.8 %
PEEP/CPAP: 5 cmH2O
Patient temperature: 98.6
VT: 450 mL
pCO2 arterial: 41.7 mmHg (ref 32.0–48.0)
pH, Arterial: 7.188 — CL (ref 7.350–7.450)
pO2, Arterial: 183 mmHg — ABNORMAL HIGH (ref 83.0–108.0)

## 2017-01-16 LAB — I-STAT ARTERIAL BLOOD GAS, ED
Acid-base deficit: 9 mmol/L — ABNORMAL HIGH (ref 0.0–2.0)
Bicarbonate: 17.1 mmol/L — ABNORMAL LOW (ref 20.0–28.0)
O2 Saturation: 99 %
Patient temperature: 98.6
TCO2: 18 mmol/L (ref 0–100)
pCO2 arterial: 38.9 mmHg (ref 32.0–48.0)
pH, Arterial: 7.251 — ABNORMAL LOW (ref 7.350–7.450)
pO2, Arterial: 188 mmHg — ABNORMAL HIGH (ref 83.0–108.0)

## 2017-01-16 LAB — BASIC METABOLIC PANEL
ANION GAP: 16 — AB (ref 5–15)
ANION GAP: 13 (ref 5–15)
ANION GAP: 14 (ref 5–15)
BUN: 71 mg/dL — ABNORMAL HIGH (ref 6–20)
BUN: 71 mg/dL — ABNORMAL HIGH (ref 6–20)
BUN: 70 mg/dL — AB (ref 6–20)
CO2: 15 mmol/L — ABNORMAL LOW (ref 22–32)
CO2: 17 mmol/L — ABNORMAL LOW (ref 22–32)
CO2: 19 mmol/L — ABNORMAL LOW (ref 22–32)
Calcium: 7.9 mg/dL — ABNORMAL LOW (ref 8.9–10.3)
Calcium: 7.9 mg/dL — ABNORMAL LOW (ref 8.9–10.3)
Calcium: 8 mg/dL — ABNORMAL LOW (ref 8.9–10.3)
Chloride: 105 mmol/L (ref 101–111)
Chloride: 107 mmol/L (ref 101–111)
Chloride: 105 mmol/L (ref 101–111)
Creatinine, Ser: 3.79 mg/dL — ABNORMAL HIGH (ref 0.44–1.00)
Creatinine, Ser: 3.86 mg/dL — ABNORMAL HIGH (ref 0.44–1.00)
Creatinine, Ser: 3.99 mg/dL — ABNORMAL HIGH (ref 0.44–1.00)
GFR calc Af Amer: 12 mL/min — ABNORMAL LOW (ref >60)
GFR calc Af Amer: 12 mL/min — ABNORMAL LOW (ref >60)
GFR, EST AFRICAN AMERICAN: 12 mL/min — AB (ref >60)
GFR, EST NON AFRICAN AMERICAN: 11 mL/min — AB (ref >60)
GFR, EST NON AFRICAN AMERICAN: 10 mL/min — AB (ref >60)
GFR, EST NON AFRICAN AMERICAN: 10 mL/min — AB (ref >60)
Glucose, Bld: 229 mg/dL — ABNORMAL HIGH (ref 65–99)
Glucose, Bld: 54 mg/dL — ABNORMAL LOW (ref 65–99)
Glucose, Bld: 82 mg/dL (ref 65–99)
POTASSIUM: 4.5 mmol/L (ref 3.5–5.1)
POTASSIUM: 4.2 mmol/L (ref 3.5–5.1)
POTASSIUM: 4.4 mmol/L (ref 3.5–5.1)
SODIUM: 136 mmol/L (ref 135–145)
SODIUM: 137 mmol/L (ref 135–145)
SODIUM: 138 mmol/L (ref 135–145)

## 2017-01-16 LAB — COMPREHENSIVE METABOLIC PANEL
ALBUMIN: 2.2 g/dL — AB (ref 3.5–5.0)
ALK PHOS: 135 U/L — AB (ref 38–126)
ALT: 50 U/L (ref 14–54)
ANION GAP: 11 (ref 5–15)
AST: 111 U/L — ABNORMAL HIGH (ref 15–41)
BUN: 73 mg/dL — ABNORMAL HIGH (ref 6–20)
CALCIUM: 8 mg/dL — AB (ref 8.9–10.3)
CO2: 15 mmol/L — AB (ref 22–32)
CREATININE: 3.81 mg/dL — AB (ref 0.44–1.00)
Chloride: 106 mmol/L (ref 101–111)
GFR calc Af Amer: 12 mL/min — ABNORMAL LOW (ref >60)
GFR calc non Af Amer: 11 mL/min — ABNORMAL LOW (ref >60)
GLUCOSE: 498 mg/dL — AB (ref 65–99)
Potassium: 5.9 mmol/L — ABNORMAL HIGH (ref 3.5–5.1)
SODIUM: 132 mmol/L — AB (ref 135–145)
Total Bilirubin: 1.1 mg/dL (ref 0.3–1.2)
Total Protein: 6.4 g/dL — ABNORMAL LOW (ref 6.5–8.1)

## 2017-01-16 LAB — ECHOCARDIOGRAM COMPLETE
Height: 62 in
Weight: 1834.23 oz

## 2017-01-16 LAB — MAGNESIUM: Magnesium: 2.1 mg/dL (ref 1.7–2.4)

## 2017-01-16 LAB — CORTISOL: Cortisol, Plasma: 29 ug/dL

## 2017-01-16 LAB — MRSA PCR SCREENING: MRSA by PCR: NEGATIVE

## 2017-01-16 LAB — LACTIC ACID, PLASMA
LACTIC ACID, VENOUS: 2 mmol/L — AB (ref 0.5–1.9)
Lactic Acid, Venous: 2.2 mmol/L (ref 0.5–1.9)

## 2017-01-16 LAB — TROPONIN I
Troponin I: 0.09 ng/mL (ref <0.03)
Troponin I: 4.64 ng/mL

## 2017-01-16 LAB — PHOSPHORUS: Phosphorus: 6.5 mg/dL — ABNORMAL HIGH (ref 2.5–4.6)

## 2017-01-16 MED ORDER — ORAL CARE MOUTH RINSE: 15.0000 mL | Freq: Four times a day (QID) | OROMUCOSAL | Status: DC

## 2017-01-16 MED ORDER — ACETAMINOPHEN 160 MG/5ML PO SOLN
650.0000 mg | ORAL | Status: DC | PRN
  Administered 2017-01-16: 650 mg
  Filled 2017-01-16: qty 20.3

## 2017-01-16 MED ORDER — SODIUM POLYSTYRENE SULFONATE 15 GM/60ML PO SUSP
30.0000 g | Freq: Once | ORAL | Status: AC
  Administered 2017-01-16: 30 g
  Filled 2017-01-16: qty 120

## 2017-01-16 MED ORDER — INSULIN ASPART 100 UNIT/ML ~~LOC~~ SOLN
12.0000 [IU] | Freq: Once | SUBCUTANEOUS | Status: DC
Start: 2017-01-16 — End: 2017-01-16

## 2017-01-16 MED ORDER — CHLORHEXIDINE GLUCONATE 0.12% ORAL RINSE (MEDLINE KIT)
15.0000 mL | Freq: Two times a day (BID) | OROMUCOSAL | Status: DC
  Administered 2017-01-16 – 2017-01-22 (×7): 15 mL via OROMUCOSAL

## 2017-01-16 MED ORDER — DEXTROSE 50 % IV SOLN
INTRAVENOUS | Status: AC
  Administered 2017-01-16: 50 mL
  Filled 2017-01-16: qty 50

## 2017-01-16 MED ORDER — INSULIN ASPART 100 UNIT/ML ~~LOC~~ SOLN
0.0000 [IU] | SUBCUTANEOUS | Status: DC
  Administered 2017-01-17: 2 [IU] via SUBCUTANEOUS
  Administered 2017-01-17: 1 [IU] via SUBCUTANEOUS
  Administered 2017-01-18 (×6): 2 [IU] via SUBCUTANEOUS
  Administered 2017-01-19: 1 [IU] via SUBCUTANEOUS
  Administered 2017-01-19: 2 [IU] via SUBCUTANEOUS

## 2017-01-16 MED ORDER — CHLORHEXIDINE GLUCONATE 0.12% ORAL RINSE (MEDLINE KIT): 15.0000 mL | Freq: Two times a day (BID) | OROMUCOSAL | Status: DC

## 2017-01-16 MED ORDER — SODIUM CHLORIDE 0.9 % IV SOLN
INTRAVENOUS | Status: DC
  Filled 2017-01-16: qty 1

## 2017-01-16 MED ORDER — STERILE WATER FOR INJECTION IV SOLN
INTRAVENOUS | Status: DC
  Administered 2017-01-16 (×2): via INTRAVENOUS
  Filled 2017-01-16 (×3): qty 850

## 2017-01-16 MED ORDER — INSULIN ASPART 100 UNIT/ML ~~LOC~~ SOLN
10.0000 [IU] | Freq: Once | SUBCUTANEOUS | Status: AC
  Administered 2017-01-16: 10 [IU] via SUBCUTANEOUS
  Filled 2017-01-16: qty 1

## 2017-01-16 MED ORDER — INSULIN ASPART 100 UNIT/ML ~~LOC~~ SOLN
12.0000 [IU] | Freq: Once | SUBCUTANEOUS | Status: AC
  Administered 2017-01-16: 12 [IU] via INTRAVENOUS

## 2017-01-16 MED ORDER — ORAL CARE MOUTH RINSE
15.0000 mL | Freq: Four times a day (QID) | OROMUCOSAL | Status: DC
  Administered 2017-01-16 – 2017-01-22 (×22): 15 mL via OROMUCOSAL

## 2017-01-16 NOTE — H&P (Addendum)
PULMONARY / CRITICAL CARE MEDICINE   Name: Nastasha Reising MRN: [id number] DOB: [birth year]    ADMISSION DATE:  01/25/2017 CONSULTATION DATE:  01/16/17  REFERRING MD:  Dr Thomasene Lot (ER)  CHIEF COMPLAINT: Hypoxic respiratory failure, Shock  HISTORY OF PRESENT ILLNESS:   76yoF with CHF, CKD, Depression, DM, HCC, Anxiety (on chronic benzos), presented to the ER via EMS for SOB and Weakness. En-route to the ER she became unresponsive and hypotensive. Patient intubated on arrival to the ER. After intubation it was discovered that patient was DNR/DNI. She was found to be in shock (BP 63/47) and started on vasopressors peripherally. At the time of my exam, patient is waking up somewhat and grabbing for the ETT. Two family members are present and are trying to decide how aggressive they want [place] to be. I reviewed her current status and plan of care with them thus far.   PAST MEDICAL HISTORY :  She  has a past medical history of Anxiety; CHF (congestive heart failure) ([place]); Chronic kidney disease; Depression; Diabetes mellitus without complication ([place]); Hepatocellular carcinoma ([place]); Hypertension; and Presence of permanent cardiac pacemaker.  PAST SURGICAL HISTORY: She  has a past surgical history that includes Insert / replace / remove pacemaker and embolization.  Allergies  Allergen Reactions  . Vicodin [Hydrocodone-Acetaminophen] Nausea And Vomiting    No current facility-administered medications on file prior to encounter.    Current Outpatient Prescriptions on File Prior to Encounter  Medication Sig  . hydrALAZINE (APRESOLINE) 25 MG tablet TAKE 1 TABLET BY MOUTH THREE TIMES DAILY  . HYDROmorphone (DILAUDID) 2 MG tablet Take 1 tablet (2 mg total) by mouth every 8 (eight) hours as needed for severe pain. (Patient taking differently: Take 4 mg by mouth every 6 (six) hours as needed for severe pain. )  . isosorbide mononitrate (IMDUR) 30 MG 24 hr tablet Take 1 tablet (30 mg total) by mouth  daily. (Patient taking differently: Take 60 mg by mouth daily. )  . NIFEdipine (PROCARDIA XL/ADALAT-CC) 90 MG 24 hr tablet Take 1 tablet (90 mg total) by mouth daily.  Marland Kitchen aspirin EC 81 MG tablet Take 1 tablet (81 mg total) by mouth daily. (Patient not taking: Reported on 01/26/2017)  . furosemide (LASIX) 80 MG tablet Take 1 tablet (80 mg total) by mouth 2 (two) times daily. (Patient not taking: Reported on 01/18/2017)  . INS SYRINGE/NEEDLE 1CC/28G (B-D INSULIN SYRINGE 1CC/28G) 28G X 1/2" 1 ML MISC 10 Units by Does not apply route daily at 10 pm. (Patient not taking: Reported on 01/13/2017)  . Insulin Glargine (LANTUS) 100 UNIT/ML Solostar Pen Inject 10 Units into the skin daily at 10 pm. (Patient not taking: Reported on 01/18/2017)  . LORazepam (ATIVAN) 0.5 MG tablet Take one half tablet daily for one week and then stop. (Patient not taking: Reported on 01/16/2017)  . metoCLOPramide (REGLAN) 5 MG/5ML solution Take 2.5 mLs (2.5 mg total) by mouth 4 (four) times daily -  before meals and at bedtime. (Patient not taking: Reported on 04/22/2016)  . metoprolol succinate (TOPROL-XL) 50 MG 24 hr tablet Take 3 tablets (150 mg total) by mouth daily. Take with or immediately following a meal. (Patient not taking: Reported on 01/26/2017)  . pantoprazole (PROTONIX) 40 MG tablet Take 1 tablet (40 mg total) by mouth daily. (Patient not taking: Reported on 01/11/2017)  . potassium chloride SA (K-DUR,KLOR-CON) 20 MEQ tablet Take 2 tablets (40 mEq total) by mouth daily. (Patient not taking: Reported on 01/22/2017)  . traZODone (DESYREL) 50  MG tablet TAKE 1 TABLET BY MOUTH EVERY NIGHT AT BEDTIME AS NEEDED FOR SLEEP (Patient not taking: Reported on 01/18/2017)   FAMILY HISTORY:  Her indicated that her mother is deceased. She indicated that her father is deceased. She indicated that both of her sisters are alive. She indicated that all of her five brothers are alive.   SOCIAL HISTORY: She  reports that she quit smoking about 13  months ago. Her smoking use included Cigarettes. She has a 12.50 pack-year smoking history. She has never used smokeless tobacco. She reports that she does not drink alcohol or use drugs.  REVIEW OF SYSTEMS:   Review of Systems  Unable to perform ROS: Critical illness   VITAL SIGNS: BP 122/65   Pulse 68   Temp (!) 96.1 F (35.6 C)   Resp 20   Ht 5\' 2"  (1.575 m)   Wt 52 kg (114 lb 10.2 oz)   SpO2 100%   BMI 20.97 kg/m   HEMODYNAMICS:    Levophed @ 65mcg and Epi @ 8 (both through PIV's)  VENTILATOR SETTINGS: Vent Mode: PRVC FiO2 (%):  [100 %] 100 % Set Rate:  [14 bmp-16 bmp] 16 bmp Vt Set:  [450 mL-500 mL] 450 mL PEEP:  [5 cmH20] 5 cmH20 Plateau Pressure:  [19 cmH20-23 cmH20] 23 cmH20  INTAKE / OUTPUT: I/O last 3 completed shifts: In: 2289.9 [I.V.:2239.9; IV Piggyback:50] Out: -   PHYSICAL EXAMINATION: General: calm on vent Neuro: perr rass -3 HEENT: no jvd. ett PULM: ronchi CV: s1 s2 RRR no r GI: sof, BS wnl, no r/g Extremities: edema  Min    LABS:  BMET  Recent Labs Lab 01/25/2017 1744 01/20/2017 2145 01/16/17 0047  NA 135 134* 132*  K 6.6* 6.2* 5.9*  CL 110 109 106  CO2  --  13* 15*  BUN 66* 72* 73*  CREATININE 3.60* 3.83* 3.81*  GLUCOSE 487* 442* 498*   Electrolytes  Recent Labs Lab 01/14/2017 2145 01/16/17 0047  CALCIUM 8.1* 8.0*  MG  --  2.1  PHOS  --  6.5*   CBC  Recent Labs Lab 01/31/2017 1705 01/18/2017 1744 01/16/17 0452  WBC 15.4*  --  23.0*  HGB 7.8* 7.8* 7.8*  HCT 23.3* 23.0* 22.7*  PLT 430*  --  462*   Coag's  Recent Labs Lab 01/30/2017 1705  APTT 32  INR 1.25   Sepsis Markers  Recent Labs Lab 01/21/2017 2151 01/16/17 0047 01/16/17 0155  LATICACIDVEN 3.27* 2.2* 2.0*   ABG  Recent Labs Lab 01/11/2017 2012 01/16/17 0033 01/16/17 0430  PHART 7.551* 7.251* 7.188*  PCO2ART <15.0* 38.9 41.7  PO2ART 93.0 188.0* 183*   Liver Enzymes  Recent Labs Lab 01/16/2017 2145 01/16/17 0047  AST 123* 111*  ALT 48 50  ALKPHOS  138* 135*  BILITOT 0.5 1.1  ALBUMIN 2.3* 2.2*   Cardiac Enzymes  Recent Labs Lab 01/16/17 0047  TROPONINI 0.09*   Glucose  Recent Labs Lab 01/13/2017 1711 02/01/2017 2358 01/16/17 0452  GLUCAP 414* 461* 337*    Imaging Ct Abdomen Pelvis Wo Contrast  Result Date: 01/12/2017 CLINICAL DATA:  Acute respiratory illness. Hepatocellular carcinoma. Chronic kidney disease. Lung metastases. Recently transferred from [place]. EXAM: CT CHEST, ABDOMEN AND PELVIS WITHOUT CONTRAST TECHNIQUE: Multidetector CT imaging of the chest, abdomen and pelvis was performed following the standard protocol without IV contrast. COMPARISON:  Portable chest obtained earlier today. FINDINGS: CT CHEST FINDINGS Cardiovascular: Enlarged heart. Left subclavian pacemaker leads. Atheromatous arterial calcifications, including the  aorta and coronary arteries. No pericardial fluid. Mediastinum/Nodes: Orogastric tube extending into the stomach. Endotracheal tube in satisfactory position. Multiple enlarged mediastinal nodes. These include a precarinal node with a short axis diameter of 23 mm on image number 25 of series 7. Lungs/Pleura: Multiple bilateral lung nodules. The largest is in the left upper lobe, measuring 1.8 cm in maximum diameter on image number 14 of series 8. Moderate-sized right pleural effusion. Small left pleural effusion. The pleural fluid on the right includes loculated fluid superiorly in the medial aspect of the hemithorax. There is also right upper lobe and right lower lobe atelectasis. Mild left lower lobe atelectasis. Musculoskeletal: Thoracic and lower cervical spine degenerative changes. No evidence of bony metastatic disease. CT ABDOMEN PELVIS FINDINGS Hepatobiliary: Large area of mildly decreased density in the posterior segment of the right lobe of the liver, measuring approximately 7.2 x 5.7 cm on image number 67 of series 7. The remainder of the liver is mildly heterogeneous. No well-defined liver masses  are visualized. The gallbladder is not visualized. There is some air in the biliary tree. Pancreas: Not well visualized, grossly unremarkable with with the exception of air in the pancreatic duct. Spleen: Small and oval in configuration. Adrenals/Urinary Tract: Poorly defined adrenal glands, grossly unremarkable. Small kidneys. Unremarkable urinary bladder. No visible urinary tract calculi or hydronephrosis. Stomach/Bowel: Orogastric tube tip in the distal stomach. The stomach is dilated. Unremarkable small bowel, colon and appendix. Vascular/Lymphatic: Atheromatous arterial calcifications without aneurysm. Mildly enlarged retrocrural lymph nodes. These include a 10 mm short axis node on image number 56 of series 7. Mildly enlarged retroperitoneal lymph nodes, including a left para-aortic node with a short axis diameter of 8 mm on image number 62 of series 7. Reproductive: Calcified uterine fibroids. Large left ovarian cyst, measuring 8.2 cm in maximum diameter. No visible soft tissue components. Other: Diffuse subcutaneous edema. Musculoskeletal: Mild lumbar spine degenerative changes. No evidence of bony metastatic disease. IMPRESSION: 1. Multiple lung metastases. 2. Moderate-sized right pleural effusion. This includes loculated pleural fluid superiorly, possibly due to pleural metastatic disease. 3. Small left pleural effusion. 4. Bilateral atelectasis, greater on the right. 5. Metastatic mediastinal adenopathy. 6. Approximately 7.2 x 5.7 cm ill-defined area of low density in the posterior segment of the right lobe of the liver. This could represent the patient's known hepatocellular carcinoma, metastasis or geographical area of steatosis. 7. Minimal retrocrural and retroperitoneal adenopathy. This could be metastatic or reactive. 8. 8.2 cm left ovarian cyst. At this age, this is concerning for a possible ovarian neoplasm. Electronically Signed   By: Claudie Revering M.D.   On: 01/13/2017 19:19   Dg Chest 1  View  Result Date: 01/17/2017 CLINICAL DATA:  Initial evaluation for intubation. EXAM: CHEST 1 VIEW COMPARISON:  Prior radiograph from 03/12/2016. FINDINGS: Endotracheal tube in place with tip positioned 3.7 cm above the carina. Enteric tube courses in the the abdomen. Left-sided pacemaker/ AICD. Defibrillator pad overlies left chest. Stable cardiomegaly. Mediastinal silhouette within normal limits. Aortic atherosclerosis. Large left pleural effusion. Diffuse vascular congestion with interstitial prominence, right greater than left, suggesting pulmonary edema. No definite focal infiltrates. No appreciable pneumothorax on this supine projection. No acute osseus abnormality. IMPRESSION: 1. Tip of the endotracheal tube 3.7 cm above the carina. 2. Cardiomegaly with pulmonary interstitial edema and large right pleural effusion. Electronically Signed   By: Jeannine Boga M.D.   On: 02/02/2017 17:45   Ct Head Wo Contrast  Result Date: 01/25/2017 CLINICAL DATA:  Altered mental status.  Hepatocellular carcinoma. EXAM: CT HEAD WITHOUT CONTRAST TECHNIQUE: Contiguous axial images were obtained from the base of the skull through the vertex without intravenous contrast. COMPARISON:  None. FINDINGS: Brain: There is a high density mass in the posterior right frontal lobe extending through eroded bone at the skull vertex into the scalp soft tissues. In the coronal plane, this mass extends across midline and measures 4.3 x 2.7 cm on image number 34. This extends into the posterior right frontal lobe with adjacent white matter low density. No significant mass effect. Also demonstrated is an oval high density mass in the left cerebellar hemisphere, measuring 2.2 x 1.5 cm on image 5 of series 3 without adjacent bone involvement. Also demonstrated is a similar-appearing mass in the right cerebellar hemisphere, measuring 0.9 x 0.8 cm on image number 4 of series 3. This also does not involve nearby bone. Diffusely enlarged  ventricles and subarachnoid spaces. Mild patchy white matter low density in both cerebral hemispheres. No intracranial hemorrhage. Vascular: The mass in the superior aspect of the posterior right frontal lobe and extending through the skull vertex is most likely invading and obstructing the superior sagittal sinus. Skull: Mass eroding through the skull vertex, as described above. Sinuses/Orbits: No acute finding. Other: Endotracheal and orogastric tubes in place. IMPRESSION: 1. Large metastasis in the superior aspect of the posterior right frontal lobe extending through the skull vertex, across midline and into the overlying scalp soft tissues. There is associated vasogenic edema in the adjacent right frontal lobe white matter. This would be amenable to percutaneous biopsy. 2. Additional metastases in the right and left cerebellar hemispheres. 3. No intracranial hemorrhage or mass effect. 4. Mild to moderate diffuse cerebral and cerebellar atrophy and mild chronic small vessel white matter ischemic changes in both cerebral hemispheres. Electronically Signed   By: Claudie Revering M.D.   On: 01/19/2017 19:02   Ct Chest Wo Contrast  Result Date: 01/24/2017 CLINICAL DATA:  Acute respiratory illness. Hepatocellular carcinoma. Chronic kidney disease. Lung metastases. Recently transferred from [place]. EXAM: CT CHEST, ABDOMEN AND PELVIS WITHOUT CONTRAST TECHNIQUE: Multidetector CT imaging of the chest, abdomen and pelvis was performed following the standard protocol without IV contrast. COMPARISON:  Portable chest obtained earlier today. FINDINGS: CT CHEST FINDINGS Cardiovascular: Enlarged heart. Left subclavian pacemaker leads. Atheromatous arterial calcifications, including the aorta and coronary arteries. No pericardial fluid. Mediastinum/Nodes: Orogastric tube extending into the stomach. Endotracheal tube in satisfactory position. Multiple enlarged mediastinal nodes. These include a precarinal node with a short axis  diameter of 23 mm on image number 25 of series 7. Lungs/Pleura: Multiple bilateral lung nodules. The largest is in the left upper lobe, measuring 1.8 cm in maximum diameter on image number 14 of series 8. Moderate-sized right pleural effusion. Small left pleural effusion. The pleural fluid on the right includes loculated fluid superiorly in the medial aspect of the hemithorax. There is also right upper lobe and right lower lobe atelectasis. Mild left lower lobe atelectasis. Musculoskeletal: Thoracic and lower cervical spine degenerative changes. No evidence of bony metastatic disease. CT ABDOMEN PELVIS FINDINGS Hepatobiliary: Large area of mildly decreased density in the posterior segment of the right lobe of the liver, measuring approximately 7.2 x 5.7 cm on image number 67 of series 7. The remainder of the liver is mildly heterogeneous. No well-defined liver masses are visualized. The gallbladder is not visualized. There is some air in the biliary tree. Pancreas: Not well visualized, grossly unremarkable with with the exception of air in the  pancreatic duct. Spleen: Small and oval in configuration. Adrenals/Urinary Tract: Poorly defined adrenal glands, grossly unremarkable. Small kidneys. Unremarkable urinary bladder. No visible urinary tract calculi or hydronephrosis. Stomach/Bowel: Orogastric tube tip in the distal stomach. The stomach is dilated. Unremarkable small bowel, colon and appendix. Vascular/Lymphatic: Atheromatous arterial calcifications without aneurysm. Mildly enlarged retrocrural lymph nodes. These include a 10 mm short axis node on image number 56 of series 7. Mildly enlarged retroperitoneal lymph nodes, including a left para-aortic node with a short axis diameter of 8 mm on image number 62 of series 7. Reproductive: Calcified uterine fibroids. Large left ovarian cyst, measuring 8.2 cm in maximum diameter. No visible soft tissue components. Other: Diffuse subcutaneous edema. Musculoskeletal: Mild  lumbar spine degenerative changes. No evidence of bony metastatic disease. IMPRESSION: 1. Multiple lung metastases. 2. Moderate-sized right pleural effusion. This includes loculated pleural fluid superiorly, possibly due to pleural metastatic disease. 3. Small left pleural effusion. 4. Bilateral atelectasis, greater on the right. 5. Metastatic mediastinal adenopathy. 6. Approximately 7.2 x 5.7 cm ill-defined area of low density in the posterior segment of the right lobe of the liver. This could represent the patient's known hepatocellular carcinoma, metastasis or geographical area of steatosis. 7. Minimal retrocrural and retroperitoneal adenopathy. This could be metastatic or reactive. 8. 8.2 cm left ovarian cyst. At this age, this is concerning for a possible ovarian neoplasm. Electronically Signed   By: Claudie Revering M.D.   On: 01/28/2017 19:19   Dg Chest Port 1 View  Result Date: 01/16/2017 CLINICAL DATA:  Acute onset of respiratory failure. Initial encounter. EXAM: PORTABLE CHEST 1 VIEW COMPARISON:  Chest radiograph performed 01/11/2017 FINDINGS: The patient's endotracheal tube is seen ending 4-5 cm above the carina. An enteric tube is noted extending below the diaphragm. A relatively large right-sided pleural effusion is again noted, perhaps slightly increased from the prior study. Underlying vascular congestion is noted. Increased interstitial markings raise concern for pulmonary edema or possibly pneumonia. No pneumothorax is seen. The cardiomediastinal silhouette is mildly enlarged. A pacemaker is noted overlying the left chest wall, with leads ending overlying the right atrium and right ventricle. External pacing pads are noted. No acute osseous abnormalities are identified. IMPRESSION: 1. Endotracheal tube seen ending 4-5 cm above the carina. 2. Relatively large right-sided pleural effusion again noted, perhaps slightly increased from the prior study. Underlying vascular congestion and mild  cardiomegaly. Increased interstitial markings raise concern for pulmonary edema or possibly pneumonia. Electronically Signed   By: Garald Balding M.D.   On: 01/16/2017 04:46   Dg Chest Portable 1 View  Result Date: 01/19/2017 CLINICAL DATA:  ETT placement EXAM: PORTABLE CHEST 1 VIEW COMPARISON:  01/06/2017 FINDINGS: Endotracheal tube tip is about 2.5 cm superior to the carina. Left-sided pacing device similar compared to prior. Moderate right pleural effusion or thickening has slightly increased. Diffuse hazy opacity in the right thorax likely due to layering effusion and underlying atelectasis or infiltrate. Cardiomegaly with atherosclerosis. IMPRESSION: 1. Endotracheal tube tip about 2.5 cm superior to carina 2. Cardiomegaly. 3. Increased large right-sided pleural effusion with diffuse hazy opacity in the right thorax which may be due to combination of layering fluid and underlying parenchymal disease. Electronically Signed   By: Donavan Foil M.D.   On: 01/10/2017 23:48   Dg Abd Portable 1v  Result Date: 01/16/2017 CLINICAL DATA:  76 y/o  F; orogastric tube placement. EXAM: PORTABLE ABDOMEN - 1 VIEW COMPARISON:  02/01/2017 CT of the abdomen and pelvis. FINDINGS: Severe gastric distention. Enteric  tube tip tip projects over the distal stomach. No acute osseous abnormality identified. Aortic and iliofemoral calcific atherosclerosis. IMPRESSION: Severe gastric distention. Enteric tube tip projects over distal stomach. Electronically Signed   By: Kristine Garbe M.D.   On: 01/16/2017 05:32   STUDIES:  Head CT (8/12):  1. Large metastasis in the superior aspect of the posterior right frontal lobe extending through the skull vertex, across midline and into the overlying scalp soft tissues. There is associated vasogenic edema in the adjacent right frontal lobe white matter. This would be amenable to percutaneous biopsy. 2. Additional metastases in the right and left cerebellar hemispheres. 3. No  intracranial hemorrhage or mass effect. 4. Mild to moderate diffuse cerebral and cerebellar atrophy and mild chronic small vessel white matter ischemic changes in both cerebral hemispheres.  CT Chest/Abdomen/Pelvis (8/12):  1. Multiple lung metastases. 2. Moderate-sized right pleural effusion. This includes loculated pleural fluid superiorly, possibly due to pleural metastatic disease. 3. Small left pleural effusion. 4. Bilateral atelectasis, greater on the right. 5. Metastatic mediastinal adenopathy. 6. Approximately 7.2 x 5.7 cm ill-defined area of low density in the posterior segment of the right lobe of the liver. This could represent the patient's known hepatocellular carcinoma, metastasis or geographical area of steatosis. 7. Minimal retrocrural and retroperitoneal adenopathy. This could be metastatic or reactive. 8. 8.2 cm left ovarian cyst. At this age, this is concerning for a possible ovarian neoplasm.  LINES/TUBES: 22G and 20G PIV's   ASSESSMENT / PLAN: 89yoF with CHF, CKD, Depression, DM, HCC, Anxiety (on chronic benzos), presented to the ER via EMS for SOB and Weakness. En-route to the ER she became unresponsive and hypotensive. Patient intubated on arrival to the ER. After intubation it was discovered that patient was DNR/DNI. She was found to be in shock (BP 63/47) and started on vasopressors peripherally   PULMONARY 1. Acute hypoxic respiratory failure; R- Pleural effusion; Atelectasis vs Pneumonia; Pulmonary metastases: - continue mechanical ventilation; initial ABG post intubation showed respiratory alkylosis due over-ventilation. Both RR and Vt were decreased with repeat ABG showing resolution of the alkylosis.  -no SBT, ph noted -will discuss comfort care with family, chest tube would be painful and not prolong qaulity life -rate increase 30  -peep 5, fio2 to 40% succesful -no role bicarb for resp status  CARDIOVASCULAR 2. Shock; CHF (EF 30-35% in  03/2016): -we have maxed levophed - no role restart epi -will discuss line vs comfort care, cvp etc -NO role trop  RENAL 3. CKD Stage V; Hyperkalemia NONAG - bicarb until K further reduced -correct ph to correct K -bmet q8h -kay x 1 -bicarb for nNO AG   GASTROINTESTINAL No active issues   HEMATOLOGIC 1. Hepatocellular Carcinoma: this was a known diagnosis, but family was not aware of any mets.   2. Ovarian cyst vs mass: at her age this is concerning to be malignancy  3. 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 CT Chest shows multiple pulmonary mets. Unclear if this is metastasis from the liver or ovary. Discussed briefly with family that patient is likely not strong enough to tolerate chemotherapy. And with such advanced disease, I am not sure it would extend her lifespan much anyway. They are not sure at this time how aggressive they wish for [place] to be. They do say she is DNR. They are awaiting for another family member to arrive before making decisions regarding withdrawal vs continued treatment and workup of the malignancy.   -  would prefer lovenox but clearance not allow - sub q hep -cbc in am  -no Tx planned, cbc follow up in am   INFECTIOUS 1. Shock, possibly septic; Metabolic acidosis: r/o PNA -zosyn vanc, she has nosocomial exposure -BC pending  ENDOCRINE 1. DM: no AI - NPO -no role stress roids -may nee dinsulin drip, having d/w family, hold off drip, add insulin IV x 1 , repeat glu in 1 hour  NEUROLOGIC 1. Acute encephalopathy:  2. Brain mets Fent, can max to 400 -low threshold decadron for mets, follow glu trend prior and family meeting  FAMILY  - Updated by ME, will discuss comfort now  50 min ccm time  Lavon Paganini. Titus Mould, MD, [place] Pgr: [facility]   D/w family, son duaghter, NO escalation, NO LINE, NO HD, if improves, revisit likely dc vent in 48 hours  pending progress or lack of  Lavon Paganini. Titus Mould, MD, [place] Pgr: [facility]

## 2017-01-16 NOTE — Progress Notes (Signed)
  Echocardiogram 2D Echocardiogram has been performed.  Reiner Loewen L Androw 01/16/2017, 2:03 PM

## 2017-01-16 NOTE — ED Notes (Signed)
Dr. Jimmey Ralph explained plan of care to pt.'s family .

## 2017-01-16 NOTE — H&P (Signed)
PULMONARY / CRITICAL CARE MEDICINE   Name: Anna Gray MRN: [id number] DOB: [birth year]    ADMISSION DATE:  01/20/2017 CONSULTATION DATE:  01/16/17  REFERRING MD:  Dr Thomasene Lot (ER)  CHIEF COMPLAINT: Hypoxic respiratory failure, Shock  HISTORY OF PRESENT ILLNESS:   76yoF with CHF, CKD, Depression, DM, HCC, Anxiety (on chronic benzos), presented to the ER via EMS for SOB and Weakness. En-route to the ER she became unresponsive and hypotensive. Patient intubated on arrival to the ER. After intubation it was discovered that patient was DNR/DNI. She was found to be in shock (BP 63/47) and started on vasopressors peripherally. At the time of my exam, patient is waking up somewhat and grabbing for the ETT. Two family members are present and are trying to decide how aggressive they want [place] to be. I reviewed her current status and plan of care with them thus far.   PAST MEDICAL HISTORY :  She  has a past medical history of Anxiety; CHF (congestive heart failure) ([place]); Chronic kidney disease; Depression; Diabetes mellitus without complication ([place]); Hepatocellular carcinoma ([place]); Hypertension; and Presence of permanent cardiac pacemaker.  PAST SURGICAL HISTORY: She  has a past surgical history that includes Insert / replace / remove pacemaker and embolization.  Allergies  Allergen Reactions  . Vicodin [Hydrocodone-Acetaminophen] Nausea And Vomiting    No current facility-administered medications on file prior to encounter.    Current Outpatient Prescriptions on File Prior to Encounter  Medication Sig  . hydrALAZINE (APRESOLINE) 25 MG tablet TAKE 1 TABLET BY MOUTH THREE TIMES DAILY  . HYDROmorphone (DILAUDID) 2 MG tablet Take 1 tablet (2 mg total) by mouth every 8 (eight) hours as needed for severe pain. (Patient taking differently: Take 4 mg by mouth every 6 (six) hours as needed for severe pain. )  . isosorbide mononitrate (IMDUR) 30 MG 24 hr tablet Take 1 tablet (30 mg total) by mouth  daily. (Patient taking differently: Take 60 mg by mouth daily. )  . NIFEdipine (PROCARDIA XL/ADALAT-CC) 90 MG 24 hr tablet Take 1 tablet (90 mg total) by mouth daily.  Marland Kitchen aspirin EC 81 MG tablet Take 1 tablet (81 mg total) by mouth daily. (Patient not taking: Reported on 01/19/2017)  . furosemide (LASIX) 80 MG tablet Take 1 tablet (80 mg total) by mouth 2 (two) times daily. (Patient not taking: Reported on 01/14/2017)  . INS SYRINGE/NEEDLE 1CC/28G (B-D INSULIN SYRINGE 1CC/28G) 28G X 1/2" 1 ML MISC 10 Units by Does not apply route daily at 10 pm. (Patient not taking: Reported on 01/17/2017)  . Insulin Glargine (LANTUS) 100 UNIT/ML Solostar Pen Inject 10 Units into the skin daily at 10 pm. (Patient not taking: Reported on 01/04/2017)  . LORazepam (ATIVAN) 0.5 MG tablet Take one half tablet daily for one week and then stop. (Patient not taking: Reported on 01/31/2017)  . metoCLOPramide (REGLAN) 5 MG/5ML solution Take 2.5 mLs (2.5 mg total) by mouth 4 (four) times daily -  before meals and at bedtime. (Patient not taking: Reported on 04/22/2016)  . metoprolol succinate (TOPROL-XL) 50 MG 24 hr tablet Take 3 tablets (150 mg total) by mouth daily. Take with or immediately following a meal. (Patient not taking: Reported on 01/18/2017)  . pantoprazole (PROTONIX) 40 MG tablet Take 1 tablet (40 mg total) by mouth daily. (Patient not taking: Reported on 02/02/2017)  . potassium chloride SA (K-DUR,KLOR-CON) 20 MEQ tablet Take 2 tablets (40 mEq total) by mouth daily. (Patient not taking: Reported on 01/28/2017)  . traZODone (DESYREL) 50  MG tablet TAKE 1 TABLET BY MOUTH EVERY NIGHT AT BEDTIME AS NEEDED FOR SLEEP (Patient not taking: Reported on 01/14/2017)   FAMILY HISTORY:  Her indicated that her mother is deceased. She indicated that her father is deceased. She indicated that both of her sisters are alive. She indicated that all of her five brothers are alive.   SOCIAL HISTORY: She  reports that she quit smoking about 13  months ago. Her smoking use included Cigarettes. She has a 12.50 pack-year smoking history. She has never used smokeless tobacco. She reports that she does not drink alcohol or use drugs.  REVIEW OF SYSTEMS:   Review of Systems  Unable to perform ROS: Critical illness   VITAL SIGNS: BP (!) 95/58   Pulse (!) 59   Temp (!) 96.8 F (36 C) (Tympanic)   Resp 16   Ht 5' 2.5" (1.588 m)   Wt 49.8 kg (109 lb 12.6 oz)   SpO2 100%   BMI 19.76 kg/m   HEMODYNAMICS:    Levophed @ 75mcg and Epi @ 8 (both through PIV's)  VENTILATOR SETTINGS: Vent Mode: PRVC FiO2 (%):  [100 %] 100 % Set Rate:  [14 bmp-16 bmp] 16 bmp Vt Set:  [450 mL-500 mL] 450 mL PEEP:  [5 cmH20] 5 cmH20 Plateau Pressure:  [19 cmH20-20 cmH20] 19 cmH20  INTAKE / OUTPUT: No intake/output data recorded.  PHYSICAL EXAMINATION: General: Elderly cachectic female, intubated and sedated, critically ill Neuro: RASS +1 to +2, intermittently grabbing for the ETT, not obeying commands. Is moving all extremities. PERRL, EOMI HEENT: OP clear, MM slightly dry; 7.5 ETT secured at 22 at the lip Cardiovascular: Sinus brady in 50's, no m/r/g Lungs: Coarse breath sounds b/l Abdomen: Soft NTND, BS+ Musculoskeletal: no LE edema  Skin: no rashes    LABS:  BMET  Recent Labs Lab 01/24/2017 1744 01/28/2017 2145 01/16/17 0047  NA 135 134* 132*  K 6.6* 6.2* 5.9*  CL 110 109 106  CO2  --  13* 15*  BUN 66* 72* 73*  CREATININE 3.60* 3.83* 3.81*  GLUCOSE 487* 442* 498*   Electrolytes  Recent Labs Lab 01/19/2017 2145 01/16/17 0047  CALCIUM 8.1* 8.0*  MG  --  2.1  PHOS  --  6.5*   CBC  Recent Labs Lab 02/01/2017 1705 01/14/2017 1744  WBC 15.4*  --   HGB 7.8* 7.8*  HCT 23.3* 23.0*  PLT 430*  --    Coag's  Recent Labs Lab 01/22/2017 1705  APTT 32  INR 1.25   Sepsis Markers  Recent Labs Lab 01/30/2017 1721 01/17/2017 2151 01/16/17 0047  LATICACIDVEN 7.50* 3.27* 2.2*   ABG  Recent Labs Lab 01/18/2017 2012 01/16/17 0033   PHART 7.551* 7.251*  PCO2ART <15.0* 38.9  PO2ART 93.0 188.0*   Liver Enzymes  Recent Labs Lab 01/21/2017 2145 01/16/17 0047  AST 123* 111*  ALT 48 50  ALKPHOS 138* 135*  BILITOT 0.5 1.1  ALBUMIN 2.3* 2.2*   Cardiac Enzymes  Recent Labs Lab 01/16/17 0047  TROPONINI 0.09*   Glucose  Recent Labs Lab 01/12/2017 1711 01/05/2017 2358  GLUCAP 414* 461*    Imaging Ct Abdomen Pelvis Wo Contrast  Result Date: 01/22/2017 CLINICAL DATA:  Acute respiratory illness. Hepatocellular carcinoma. Chronic kidney disease. Lung metastases. Recently transferred from [place]. EXAM: CT CHEST, ABDOMEN AND PELVIS WITHOUT CONTRAST TECHNIQUE: Multidetector CT imaging of the chest, abdomen and pelvis was performed following the standard protocol without IV contrast. COMPARISON:  Portable chest obtained earlier today.  FINDINGS: CT CHEST FINDINGS Cardiovascular: Enlarged heart. Left subclavian pacemaker leads. Atheromatous arterial calcifications, including the aorta and coronary arteries. No pericardial fluid. Mediastinum/Nodes: Orogastric tube extending into the stomach. Endotracheal tube in satisfactory position. Multiple enlarged mediastinal nodes. These include a precarinal node with a short axis diameter of 23 mm on image number 25 of series 7. Lungs/Pleura: Multiple bilateral lung nodules. The largest is in the left upper lobe, measuring 1.8 cm in maximum diameter on image number 14 of series 8. Moderate-sized right pleural effusion. Small left pleural effusion. The pleural fluid on the right includes loculated fluid superiorly in the medial aspect of the hemithorax. There is also right upper lobe and right lower lobe atelectasis. Mild left lower lobe atelectasis. Musculoskeletal: Thoracic and lower cervical spine degenerative changes. No evidence of bony metastatic disease. CT ABDOMEN PELVIS FINDINGS Hepatobiliary: Large area of mildly decreased density in the posterior segment of the right lobe of the  liver, measuring approximately 7.2 x 5.7 cm on image number 67 of series 7. The remainder of the liver is mildly heterogeneous. No well-defined liver masses are visualized. The gallbladder is not visualized. There is some air in the biliary tree. Pancreas: Not well visualized, grossly unremarkable with with the exception of air in the pancreatic duct. Spleen: Small and oval in configuration. Adrenals/Urinary Tract: Poorly defined adrenal glands, grossly unremarkable. Small kidneys. Unremarkable urinary bladder. No visible urinary tract calculi or hydronephrosis. Stomach/Bowel: Orogastric tube tip in the distal stomach. The stomach is dilated. Unremarkable small bowel, colon and appendix. Vascular/Lymphatic: Atheromatous arterial calcifications without aneurysm. Mildly enlarged retrocrural lymph nodes. These include a 10 mm short axis node on image number 56 of series 7. Mildly enlarged retroperitoneal lymph nodes, including a left para-aortic node with a short axis diameter of 8 mm on image number 62 of series 7. Reproductive: Calcified uterine fibroids. Large left ovarian cyst, measuring 8.2 cm in maximum diameter. No visible soft tissue components. Other: Diffuse subcutaneous edema. Musculoskeletal: Mild lumbar spine degenerative changes. No evidence of bony metastatic disease. IMPRESSION: 1. Multiple lung metastases. 2. Moderate-sized right pleural effusion. This includes loculated pleural fluid superiorly, possibly due to pleural metastatic disease. 3. Small left pleural effusion. 4. Bilateral atelectasis, greater on the right. 5. Metastatic mediastinal adenopathy. 6. Approximately 7.2 x 5.7 cm ill-defined area of low density in the posterior segment of the right lobe of the liver. This could represent the patient's known hepatocellular carcinoma, metastasis or geographical area of steatosis. 7. Minimal retrocrural and retroperitoneal adenopathy. This could be metastatic or reactive. 8. 8.2 cm left ovarian cyst.  At this age, this is concerning for a possible ovarian neoplasm. Electronically Signed   By: Claudie Revering M.D.   On: 01/26/2017 19:19   Dg Chest 1 View  Result Date: 01/31/2017 CLINICAL DATA:  Initial evaluation for intubation. EXAM: CHEST 1 VIEW COMPARISON:  Prior radiograph from 03/12/2016. FINDINGS: Endotracheal tube in place with tip positioned 3.7 cm above the carina. Enteric tube courses in the the abdomen. Left-sided pacemaker/ AICD. Defibrillator pad overlies left chest. Stable cardiomegaly. Mediastinal silhouette within normal limits. Aortic atherosclerosis. Large left pleural effusion. Diffuse vascular congestion with interstitial prominence, right greater than left, suggesting pulmonary edema. No definite focal infiltrates. No appreciable pneumothorax on this supine projection. No acute osseus abnormality. IMPRESSION: 1. Tip of the endotracheal tube 3.7 cm above the carina. 2. Cardiomegaly with pulmonary interstitial edema and large right pleural effusion. Electronically Signed   By: Jeannine Boga M.D.   On: 01/30/2017 17:45  Ct Head Wo Contrast  Result Date: 01/27/2017 CLINICAL DATA:  Altered mental status.  Hepatocellular carcinoma. EXAM: CT HEAD WITHOUT CONTRAST TECHNIQUE: Contiguous axial images were obtained from the base of the skull through the vertex without intravenous contrast. COMPARISON:  None. FINDINGS: Brain: There is a high density mass in the posterior right frontal lobe extending through eroded bone at the skull vertex into the scalp soft tissues. In the coronal plane, this mass extends across midline and measures 4.3 x 2.7 cm on image number 34. This extends into the posterior right frontal lobe with adjacent white matter low density. No significant mass effect. Also demonstrated is an oval high density mass in the left cerebellar hemisphere, measuring 2.2 x 1.5 cm on image 5 of series 3 without adjacent bone involvement. Also demonstrated is a similar-appearing mass in  the right cerebellar hemisphere, measuring 0.9 x 0.8 cm on image number 4 of series 3. This also does not involve nearby bone. Diffusely enlarged ventricles and subarachnoid spaces. Mild patchy white matter low density in both cerebral hemispheres. No intracranial hemorrhage. Vascular: The mass in the superior aspect of the posterior right frontal lobe and extending through the skull vertex is most likely invading and obstructing the superior sagittal sinus. Skull: Mass eroding through the skull vertex, as described above. Sinuses/Orbits: No acute finding. Other: Endotracheal and orogastric tubes in place. IMPRESSION: 1. Large metastasis in the superior aspect of the posterior right frontal lobe extending through the skull vertex, across midline and into the overlying scalp soft tissues. There is associated vasogenic edema in the adjacent right frontal lobe white matter. This would be amenable to percutaneous biopsy. 2. Additional metastases in the right and left cerebellar hemispheres. 3. No intracranial hemorrhage or mass effect. 4. Mild to moderate diffuse cerebral and cerebellar atrophy and mild chronic small vessel white matter ischemic changes in both cerebral hemispheres. Electronically Signed   By: Claudie Revering M.D.   On: 01/08/2017 19:02   Ct Chest Wo Contrast  Result Date: 02/01/2017 CLINICAL DATA:  Acute respiratory illness. Hepatocellular carcinoma. Chronic kidney disease. Lung metastases. Recently transferred from [place]. EXAM: CT CHEST, ABDOMEN AND PELVIS WITHOUT CONTRAST TECHNIQUE: Multidetector CT imaging of the chest, abdomen and pelvis was performed following the standard protocol without IV contrast. COMPARISON:  Portable chest obtained earlier today. FINDINGS: CT CHEST FINDINGS Cardiovascular: Enlarged heart. Left subclavian pacemaker leads. Atheromatous arterial calcifications, including the aorta and coronary arteries. No pericardial fluid. Mediastinum/Nodes: Orogastric tube extending  into the stomach. Endotracheal tube in satisfactory position. Multiple enlarged mediastinal nodes. These include a precarinal node with a short axis diameter of 23 mm on image number 25 of series 7. Lungs/Pleura: Multiple bilateral lung nodules. The largest is in the left upper lobe, measuring 1.8 cm in maximum diameter on image number 14 of series 8. Moderate-sized right pleural effusion. Small left pleural effusion. The pleural fluid on the right includes loculated fluid superiorly in the medial aspect of the hemithorax. There is also right upper lobe and right lower lobe atelectasis. Mild left lower lobe atelectasis. Musculoskeletal: Thoracic and lower cervical spine degenerative changes. No evidence of bony metastatic disease. CT ABDOMEN PELVIS FINDINGS Hepatobiliary: Large area of mildly decreased density in the posterior segment of the right lobe of the liver, measuring approximately 7.2 x 5.7 cm on image number 67 of series 7. The remainder of the liver is mildly heterogeneous. No well-defined liver masses are visualized. The gallbladder is not visualized. There is some air in the biliary  tree. Pancreas: Not well visualized, grossly unremarkable with with the exception of air in the pancreatic duct. Spleen: Small and oval in configuration. Adrenals/Urinary Tract: Poorly defined adrenal glands, grossly unremarkable. Small kidneys. Unremarkable urinary bladder. No visible urinary tract calculi or hydronephrosis. Stomach/Bowel: Orogastric tube tip in the distal stomach. The stomach is dilated. Unremarkable small bowel, colon and appendix. Vascular/Lymphatic: Atheromatous arterial calcifications without aneurysm. Mildly enlarged retrocrural lymph nodes. These include a 10 mm short axis node on image number 56 of series 7. Mildly enlarged retroperitoneal lymph nodes, including a left para-aortic node with a short axis diameter of 8 mm on image number 62 of series 7. Reproductive: Calcified uterine fibroids. Large  left ovarian cyst, measuring 8.2 cm in maximum diameter. No visible soft tissue components. Other: Diffuse subcutaneous edema. Musculoskeletal: Mild lumbar spine degenerative changes. No evidence of bony metastatic disease. IMPRESSION: 1. Multiple lung metastases. 2. Moderate-sized right pleural effusion. This includes loculated pleural fluid superiorly, possibly due to pleural metastatic disease. 3. Small left pleural effusion. 4. Bilateral atelectasis, greater on the right. 5. Metastatic mediastinal adenopathy. 6. Approximately 7.2 x 5.7 cm ill-defined area of low density in the posterior segment of the right lobe of the liver. This could represent the patient's known hepatocellular carcinoma, metastasis or geographical area of steatosis. 7. Minimal retrocrural and retroperitoneal adenopathy. This could be metastatic or reactive. 8. 8.2 cm left ovarian cyst. At this age, this is concerning for a possible ovarian neoplasm. Electronically Signed   By: Claudie Revering M.D.   On: 01/28/2017 19:19   Dg Chest Portable 1 View  Result Date: 01/17/2017 CLINICAL DATA:  ETT placement EXAM: PORTABLE CHEST 1 VIEW COMPARISON:  01/09/2017 FINDINGS: Endotracheal tube tip is about 2.5 cm superior to the carina. Left-sided pacing device similar compared to prior. Moderate right pleural effusion or thickening has slightly increased. Diffuse hazy opacity in the right thorax likely due to layering effusion and underlying atelectasis or infiltrate. Cardiomegaly with atherosclerosis. IMPRESSION: 1. Endotracheal tube tip about 2.5 cm superior to carina 2. Cardiomegaly. 3. Increased large right-sided pleural effusion with diffuse hazy opacity in the right thorax which may be due to combination of layering fluid and underlying parenchymal disease. Electronically Signed   By: Donavan Foil M.D.   On: 01/25/2017 23:48   STUDIES:  Head CT (8/12):  1. Large metastasis in the superior aspect of the posterior right frontal lobe extending  through the skull vertex, across midline and into the overlying scalp soft tissues. There is associated vasogenic edema in the adjacent right frontal lobe white matter. This would be amenable to percutaneous biopsy. 2. Additional metastases in the right and left cerebellar hemispheres. 3. No intracranial hemorrhage or mass effect. 4. Mild to moderate diffuse cerebral and cerebellar atrophy and mild chronic small vessel white matter ischemic changes in both cerebral hemispheres.  CT Chest/Abdomen/Pelvis (8/12):  1. Multiple lung metastases. 2. Moderate-sized right pleural effusion. This includes loculated pleural fluid superiorly, possibly due to pleural metastatic disease. 3. Small left pleural effusion. 4. Bilateral atelectasis, greater on the right. 5. Metastatic mediastinal adenopathy. 6. Approximately 7.2 x 5.7 cm ill-defined area of low density in the posterior segment of the right lobe of the liver. This could represent the patient's known hepatocellular carcinoma, metastasis or geographical area of steatosis. 7. Minimal retrocrural and retroperitoneal adenopathy. This could be metastatic or reactive. 8. 8.2 cm left ovarian cyst. At this age, this is concerning for a possible ovarian neoplasm.  LINES/TUBES: 22G and 20G PIV's  ASSESSMENT / PLAN: 20yoF with CHF, CKD, Depression, DM, HCC, Anxiety (on chronic benzos), presented to the ER via EMS for SOB and Weakness. En-route to the ER she became unresponsive and hypotensive. Patient intubated on arrival to the ER. After intubation it was discovered that patient was DNR/DNI. She was found to be in shock (BP 63/47) and started on vasopressors peripherally   PULMONARY 1. Acute hypoxic respiratory failure; R- Pleural effusion; Atelectasis vs Pneumonia; Pulmonary metastases: - continue mechanical ventilation; initial ABG post intubation showed respiratory alkylosis due over-ventilation. Both RR and Vt were decreased with repeat ABG  showing resolution of the alkylosis.  - repeat ABG in AM - CXR on my review shows right-sided pleural effusion; ETT in in correct position.  - Chest CT shows multiple pulmonary nodules consistent with mets - GI and DVT prophylaxis; NPO  CARDIOVASCULAR 2. Shock; CHF (EF 30-35% in 03/2016): - unclear if this shock is due to sepsis vs cardiogenic vs PE.  - order new TTE  - workup sepsis as below - unable to obtain CTA due to CKD - check cortisol level  RENAL 3. CKD Stage V; Hyperkalemia - creatinine 3.8 which is at her baseline in the 3's.  - place foley catheter; monitor UOP closely - potassium 6.6 on admission, now down to 5.9; continue to monitor closely  GASTROINTESTINAL No active issues   HEMATOLOGIC 1. Hepatocellular Carcinoma: this was a known diagnosis, but family was not aware of any mets.   2. Ovarian cyst vs mass: at her age this is concerning to be malignancy  3. 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 CT Chest shows multiple pulmonary mets. Unclear if this is metastasis from the liver or ovary. Discussed briefly with family that patient is likely not strong enough to tolerate chemotherapy. And with such advanced disease, I am not sure it would extend her lifespan much anyway. They are not sure at this time how aggressive they wish for [place] to be. They do say she is DNR. They are awaiting for another family member to arrive before making decisions regarding withdrawal vs continued treatment and workup of the malignancy.   INFECTIOUS 1. Shock, possibly septic; Metabolic acidosis: - lactate elevated at 7.5 on admission but couldve been due to work of breathing prior to intubation; now repeat lactate improved to 3.27 then 2.2 - check procalcitonin - UA negative. Abdomen soft. Chest CT shows right-sided pleural effusion and RLL atelectasis vs infiltrate.  - Will panculture (blood, sputum, urine) - empiric antibiotics with  Vanc and Zosyn - continue Vasopressors via PIV's; family not sure at this time if they want escalation of care. They had told ER MD that they wanted patient Do not escalate. They tell me they are not not sure.  - needs some bicarb but wanting to limit volume overloading her. Will do concentrated bicarb gtt but run it slow.   ENDOCRINE 1. DM: - NPO; Blood glucose in 400's despite SSI. Start insulin gtt   NEUROLOGIC 1. Acute encephalopathy:    FAMILY  - Updated 2 family members in the ER.   60 minutes critical care time  Vernie Murders, MD  Pulmonary and [place] Pager: [phone]  01/16/2017, 2:36 AM

## 2017-01-16 NOTE — Progress Notes (Signed)
CRITICAL VALUE ALERT  Critical Value:  PH 7.188, PO2 183, CO2 41.7  Date & Time Notied:  6578 01/16/17  Provider Notified: [place]  Orders Received/Actions taken: Increase sodium bicarb 125 and RT to increased RR 20 on ventilator.

## 2017-01-16 NOTE — ED Notes (Signed)
Dr. Elsworth Soho notified on pt.'s abnormal lab results , no new order received .

## 2017-01-16 NOTE — Progress Notes (Addendum)
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 Titus Mould, MD, [place] Pgr: [facility]

## 2017-01-16 NOTE — Progress Notes (Signed)
Spoke with son about patient prognosis.  Son states he is not comfortable with the way the MD spoke with him and that he would like a second opinion.  Paged Dr. Titus Mould to explain patients sons wishes.  MD said he would contact another MD tmw, to evaluate the patient and speak with the family.

## 2017-01-16 NOTE — ED Notes (Signed)
Dr. Jimmey Ralph notified on pt.'s elevated blood sugar and unable to collect blood specimen for blood cultures.

## 2017-01-16 NOTE — Progress Notes (Signed)
Pt transported to 33M 04 without event.  Rt given report.

## 2017-01-17 DIAGNOSIS — R0603 Acute respiratory distress: Secondary | ICD-10-CM

## 2017-01-17 DIAGNOSIS — C7931 Secondary malignant neoplasm of brain: Secondary | ICD-10-CM

## 2017-01-17 DIAGNOSIS — C7800 Secondary malignant neoplasm of unspecified lung: Secondary | ICD-10-CM

## 2017-01-17 DIAGNOSIS — J9601 Acute respiratory failure with hypoxia: Secondary | ICD-10-CM

## 2017-01-17 DIAGNOSIS — Z4659 Encounter for fitting and adjustment of other gastrointestinal appliance and device: Secondary | ICD-10-CM

## 2017-01-17 LAB — BASIC METABOLIC PANEL
ANION GAP: 17 — AB (ref 5–15)
Anion gap: 15 (ref 5–15)
BUN: 69 mg/dL — ABNORMAL HIGH (ref 6–20)
BUN: 73 mg/dL — ABNORMAL HIGH (ref 6–20)
CALCIUM: 7.9 mg/dL — AB (ref 8.9–10.3)
CALCIUM: 7.6 mg/dL — AB (ref 8.9–10.3)
CHLORIDE: 105 mmol/L (ref 101–111)
CHLORIDE: 101 mmol/L (ref 101–111)
CO2: 19 mmol/L — AB (ref 22–32)
CO2: 16 mmol/L — AB (ref 22–32)
CREATININE: 4.18 mg/dL — AB (ref 0.44–1.00)
CREATININE: 4.54 mg/dL — AB (ref 0.44–1.00)
GFR calc Af Amer: 11 mL/min — ABNORMAL LOW (ref >60)
GFR calc Af Amer: 10 mL/min — ABNORMAL LOW (ref >60)
GFR calc non Af Amer: 9 mL/min — ABNORMAL LOW (ref >60)
GFR calc non Af Amer: 9 mL/min — ABNORMAL LOW (ref >60)
GLUCOSE: 102 mg/dL — AB (ref 65–99)
GLUCOSE: 154 mg/dL — AB (ref 65–99)
Potassium: 3.7 mmol/L (ref 3.5–5.1)
Potassium: 3.6 mmol/L (ref 3.5–5.1)
Sodium: 139 mmol/L (ref 135–145)
Sodium: 134 mmol/L — ABNORMAL LOW (ref 135–145)

## 2017-01-17 LAB — GLUCOSE, CAPILLARY
GLUCOSE-CAPILLARY: 100 mg/dL — AB (ref 65–99)
Glucose-Capillary: 102 mg/dL — ABNORMAL HIGH (ref 65–99)
Glucose-Capillary: 99 mg/dL (ref 65–99)
Glucose-Capillary: 136 mg/dL — ABNORMAL HIGH (ref 65–99)
Glucose-Capillary: 156 mg/dL — ABNORMAL HIGH (ref 65–99)

## 2017-01-17 MED ORDER — DEXAMETHASONE SODIUM PHOSPHATE 10 MG/ML IJ SOLN
10.0000 mg | Freq: Four times a day (QID) | INTRAMUSCULAR | Status: DC
  Administered 2017-01-17 – 2017-01-21 (×16): 10 mg via INTRAVENOUS
  Filled 2017-01-17 (×19): qty 1

## 2017-01-17 MED ORDER — VANCOMYCIN HCL IN DEXTROSE 750-5 MG/150ML-% IV SOLN: 750.0000 mg | Freq: Once | INTRAVENOUS | Status: DC

## 2017-01-17 MED ORDER — SODIUM CHLORIDE 0.9 % IV SOLN
INTRAVENOUS | Status: DC
  Administered 2017-01-17 – 2017-01-18 (×2): via INTRAVENOUS

## 2017-01-17 MED ORDER — FAMOTIDINE IN NACL 20-0.9 MG/50ML-% IV SOLN
20.0000 mg | INTRAVENOUS | Status: DC
  Administered 2017-01-17 – 2017-01-22 (×6): 20 mg via INTRAVENOUS
  Filled 2017-01-17 (×7): qty 50

## 2017-01-17 NOTE — H&P (Addendum)
PULMONARY / CRITICAL CARE MEDICINE   Name: Anna Gray MRN: [id number] DOB: 03-01-1941    ADMISSION DATE:  02/02/2017 CONSULTATION DATE:  01/16/17  REFERRING MD:  Dr Thomasene Lot (ER)  CHIEF COMPLAINT: Hypoxic respiratory failure, Shock  HISTORY OF PRESENT ILLNESS:   76yoF with CHF, CKD, Depression, DM, HCC, Anxiety (on chronic benzos), presented to the ER via EMS for SOB and Weakness. En-route to the ER she became unresponsive and hypotensive. Patient intubated on arrival to the ER. After intubation it was discovered that patient was DNR/DNI. She was found to be in shock (BP 63/47) and started on vasopressors peripherally. At the time of my exam, patient is waking up somewhat and grabbing for the ETT. Two family members are present and are trying to decide how aggressive they want [place] to be. I reviewed her current status and plan of care with them thus far.  Found with met cancer diffuse, brian , lung , heart  REVIEW OF SYSTEMS:   Review of Systems  Unable to perform ROS: Critical illness   Subjective: not responding, remain son vent, does get agitated with mouth care, neo off Echo back with invasion in heart mass  VITAL SIGNS: BP 139/65   Pulse 62   Temp 98.9 F (37.2 C) (Axillary)   Resp (!) 30   Ht 5' 2" (1.575 m)   Wt 54.8 kg (120 lb 13 oz)   SpO2 100%   BMI 22.10 kg/m   HEMODYNAMICS: Off neo  VENTILATOR SETTINGS: Vent Mode: PRVC FiO2 (%):  [40 %] 40 % Set Rate:  [30 bmp] 30 bmp Vt Set:  [450 mL] 450 mL PEEP:  [5 cmH20] 5 cmH20 Plateau Pressure:  [22 cmH20-28 cmH20] 28 cmH20  INTAKE / OUTPUT: I/O last 3 completed shifts: In: 5002 [I.V.:4702; IV Piggyback:300] Out: 61 [Urine:205; Emesis/NG output:900]  PHYSICAL EXAMINATION: General: rass -2 Neuro: not fc, moving ext, fights mouth care HEENT: jvd increased, ett PULM: improved coarse CV:  s1 s 2 RRR distant GI: soft, bs wnl, no r Extremities: edema present   LABS:  BMET  Recent Labs Lab  01/16/17 1427 01/16/17 2023 01/17/17 0257  NA 137 138 139  K 4.2 4.4 3.7  CL 107 105 105  CO2 17* 19* 19*  BUN 71* 70* 69*  CREATININE 3.86* 3.99* 4.18*  GLUCOSE 54* 82 102*   Electrolytes  Recent Labs Lab 01/16/17 0047  01/16/17 1427 01/16/17 2023 01/17/17 0257  CALCIUM 8.0*  < > 7.9* 8.0* 7.9*  MG 2.1  --   --   --   --   PHOS 6.5*  --   --   --   --   < > = values in this interval not displayed. CBC  Recent Labs Lab 01/26/2017 1705 01/07/2017 1744 01/16/17 0452  WBC 15.4*  --  23.0*  HGB 7.8* 7.8* 7.8*  HCT 23.3* 23.0* 22.7*  PLT 430*  --  462*   Coag's  Recent Labs Lab 01/05/2017 1705  APTT 32  INR 1.25   Sepsis Markers  Recent Labs Lab 01/05/2017 2151 01/16/17 0047 01/16/17 0155  LATICACIDVEN 3.27* 2.2* 2.0*   ABG  Recent Labs Lab 01/21/2017 2012 01/16/17 0033 01/16/17 0430  PHART 7.551* 7.251* 7.188*  PCO2ART <15.0* 38.9 41.7  PO2ART 93.0 188.0* 183*   Liver Enzymes  Recent Labs Lab 02/03/2017 2145 01/16/17 0047  AST 123* 111*  ALT 48 50  ALKPHOS 138* 135*  BILITOT 0.5 1.1  ALBUMIN 2.3* 2.2*   Cardiac Enzymes  Recent Labs Lab 01/16/17 0047 01/16/17 0938  TROPONINI 0.09* 4.64*   Glucose  Recent Labs Lab 01/16/17 1549 01/16/17 1809 01/16/17 2019 01/16/17 2324 01/17/17 0314 01/17/17 0801  GLUCAP 47* 89 84 79 102* 99    Imaging No results found. STUDIES:  Head CT (8/12):  1. Large metastasis in the superior aspect of the posterior right frontal lobe extending through the skull vertex, across midline and into the overlying scalp soft tissues. There is associated vasogenic edema in the adjacent right frontal lobe white matter. This would be amenable to percutaneous biopsy. 2. Additional metastases in the right and left cerebellar hemispheres. 3. No intracranial hemorrhage or mass effect. 4. Mild to moderate diffuse cerebral and cerebellar atrophy and mild chronic small vessel white matter ischemic changes in both cerebral  hemispheres.  CT Chest/Abdomen/Pelvis (8/12):  1. Multiple lung metastases. 2. Moderate-sized right pleural effusion. This includes loculated pleural fluid superiorly, possibly due to pleural metastatic disease. 3. Small left pleural effusion. 4. Bilateral atelectasis, greater on the right. 5. Metastatic mediastinal adenopathy. 6. Approximately 7.2 x 5.7 cm ill-defined area of low density in the posterior segment of the right lobe of the liver. This could represent the patient's known hepatocellular carcinoma, metastasis or geographical area of steatosis. 7. Minimal retrocrural and retroperitoneal adenopathy. This could be metastatic or reactive. 8. 8.2 cm left ovarian cyst. At this age, this is concerning for a possible ovarian neoplasm.  ECHO 4/17>>>EYCXKG LV systolic function; moderate basal septal hypertrophy;   mild diastolic dysfunction; sclerotic aortic valve with mild AI;   mild MR; moderate TR with moderately elevated pulmonary pressure;   large mass in RA and RV that appears to be invading the atrial   and ventricular septum; findings suggestive of metastatic cardiac   lesion;   LINES/TUBES: 8/12 ett>>>   PULMONARY 1. Acute hypoxic respiratory failure; R- Pleural effusion; Atelectasis vs Pneumonia unlikely ; Pulmonary metastases -pcxr in am  -repeat abg for MV needs -SBT if we are able to reduce MV further -no role thora or chest tube  CARDIOVASCULAR 2. Shock; CHF (EF 30-35% in 03/2016), stress ischemia and invasion cancer mets heart -echo reviewed, met disease in heart rt and invasion causing trop leak, no role repeat -off pressors, no escalation -if drops BP could consider head down and left side down to have mass out of outflow track -prognosis poor, I have d/w family  RENAL 3. CKD Stage V; Hyperkalemia NONAG - not candidate for HD and family not wish Off pressors, reluctant to use aggressive diuresis with obstruction rt side heart kvo  kvo saline, dc  bicarb  GASTROINTESTINAL R/o ileus Suction NGt Npo ppi  HEMATOLOGIC 1. Hepatocellular Carcinoma: this was a known diagnosis, but family was not aware of any mets.   2. Ovarian cyst vs mass: at her age this is concerning to be malignancy and source of mets  3. 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 CT Chest shows multiple pulmonary mets. Unclear if this is metastasis from the liver or ovary. METS in heart present  -Sub q hep -cbc in am  -would not expect mets to this degree from hepatocellular ca -will send blood for cytology  INFECTIOUS 1. Shock likely NOT sepsis, appears to be from obstructive -dc vanc -zosyn, likely to dc in am   ENDOCRINE 1. DM: no AI - ssi, improved glu  NEUROLOGIC 1. Acute encephalopathy:  2. Brain mets -WUA fent -low threshold decadron for mets, consider add  and follow neurostatus  FAMILY  - Updated bye me daily. Io updated son outside of the room 8/13 about echo calmly and empathetically. He seemed very nervous and difficult to concetrate. Updating again today. Second option today dr Lamonte Sakai if they still need  50 min ccm time  Lavon Paganini. Titus Mould, MD, [place] Pgr: [facility]   Extensive meeting today with son and daughter We started out the conversation with updates in her care, echo findings, renal failure Vent needs We also discussed some mis understandings about what they knew and didtn know about their mothers cancer and how that may have impacted my commuication and delivery of additional identification of METS. I again stressed how committed I am to taking the best care of their mother and improving our communication pathways. They were appreciative and admitted that their mother may have not told them of all her advacmenent in her cancer. I also told them I would do my best with markers and blood ctyology to determine if this is hepatocellular vs  ovarian cancer mets. They y expressed that they do not want any escallation of care and do not want thir mother to suffer. We will support her and assess her mental status and vent nees in hopes they can communicate with her and possible extubate. This is unlikley, but we can assess for this for a short period of time. They agree Ccm time now 85 min  Lavon Paganini. Titus Mould, MD, [place] Pgr: [facility]

## 2017-01-17 NOTE — Progress Notes (Signed)
Dr Titus Mould had very productive meeting with the family he explain with empathy and concern the patients condition and prognosis. Explained and addressed all of families concerns to their satisfaction. Dr Lamonte Sakai came by later and spoke with family as a follow up. Explained his assessment and findings to the family. Discussed plan of care and the prognosis of the patient to the family. Both MDs were gracious and listen to families concern with keen interest making sure all their questions and concerns was addressed to their satisfaction.

## 2017-01-17 NOTE — Progress Notes (Signed)
eLink Physician-Brief Progress Note Patient Name: Anna Gray DOB: 25-Nov-1940 MRN: [id number]   Date of Service  01/17/2017  HPI/Events of Note  Anxiety - request for bilateral soft wrist restraints.   eICU Interventions  Will order bilateral soft wrist restraints.      Intervention Category Minor Interventions: Agitation / anxiety - evaluation and management  Lysle Dingwall 01/17/2017, 7:32 PM

## 2017-01-17 NOTE — Consult Note (Addendum)
PULMONARY / CRITICAL CARE MEDICINE   Name: Anna Gray MRN: [id number] DOB: [birth year]    ADMISSION DATE:  01/26/2017 CONSULTATION DATE:  01/17/17  REFERRING MD:  Dr Titus Mould  CHIEF COMPLAINT:  Multifactorial Shock, Acute respiratory failure  HISTORY OF PRESENT ILLNESS:   76 year old woman with a history of hepatocellular carcinoma, ovarian cyst versus mass, diabetes, HFrEF (EF 30-35%), CKD stage V. She was admitted on 8/13 with encephalopathy, acute respiratory failure and shock. This in the setting of moderate right pleural effusion and widespread metastatic disease to lung, mediastinum, brain. It was unclear whether the patient was aware that she had progression of malignancy; family was not aware. She was urgently ventilated, started on vasopressors, treated empirically for possible septic shock with broad-spectrum antibiotics. Echocardiogram 8/13 has since revealed a large mass in the right atrium and right ventricle that appears to be invading the atrial and ventricular septa. In retrospect her current decompensation, respiratory failure, encephalopathy, shock appear to be due to progression of malignancy.    PAST MEDICAL HISTORY :  She  has a past medical history of Anxiety; CHF (congestive heart failure) ([place]); Chronic kidney disease; Depression; Diabetes mellitus without complication ([place]); Hepatocellular carcinoma ([place]); Hypertension; and Presence of permanent cardiac pacemaker.  PAST SURGICAL HISTORY: She  has a past surgical history that includes Insert / replace / remove pacemaker and embolization.  Allergies  Allergen Reactions  . Vicodin [Hydrocodone-Acetaminophen] Nausea And Vomiting    No current facility-administered medications on file prior to encounter.    Current Outpatient Prescriptions on File Prior to Encounter  Medication Sig  . hydrALAZINE (APRESOLINE) 25 MG tablet TAKE 1 TABLET BY MOUTH THREE TIMES DAILY  . HYDROmorphone (DILAUDID) 2 MG tablet Take 1  tablet (2 mg total) by mouth every 8 (eight) hours as needed for severe pain. (Patient taking differently: Take 4 mg by mouth every 6 (six) hours as needed for severe pain. )  . isosorbide mononitrate (IMDUR) 30 MG 24 hr tablet Take 1 tablet (30 mg total) by mouth daily. (Patient taking differently: Take 60 mg by mouth daily. )  . NIFEdipine (PROCARDIA XL/ADALAT-CC) 90 MG 24 hr tablet Take 1 tablet (90 mg total) by mouth daily.  Marland Kitchen aspirin EC 81 MG tablet Take 1 tablet (81 mg total) by mouth daily. (Patient not taking: Reported on 01/06/2017)  . furosemide (LASIX) 80 MG tablet Take 1 tablet (80 mg total) by mouth 2 (two) times daily. (Patient not taking: Reported on 01/22/2017)  . INS SYRINGE/NEEDLE 1CC/28G (B-D INSULIN SYRINGE 1CC/28G) 28G X 1/2" 1 ML MISC 10 Units by Does not apply route daily at 10 pm. (Patient not taking: Reported on 01/22/2017)  . Insulin Glargine (LANTUS) 100 UNIT/ML Solostar Pen Inject 10 Units into the skin daily at 10 pm. (Patient not taking: Reported on 01/08/2017)  . LORazepam (ATIVAN) 0.5 MG tablet Take one half tablet daily for one week and then stop. (Patient not taking: Reported on 01/24/2017)  . metoCLOPramide (REGLAN) 5 MG/5ML solution Take 2.5 mLs (2.5 mg total) by mouth 4 (four) times daily -  before meals and at bedtime. (Patient not taking: Reported on 04/22/2016)  . metoprolol succinate (TOPROL-XL) 50 MG 24 hr tablet Take 3 tablets (150 mg total) by mouth daily. Take with or immediately following a meal. (Patient not taking: Reported on 01/25/2017)  . pantoprazole (PROTONIX) 40 MG tablet Take 1 tablet (40 mg total) by mouth daily. (Patient not taking: Reported on 01/06/2017)  . potassium chloride SA (K-DUR,KLOR-CON) 20 MEQ tablet  Take 2 tablets (40 mEq total) by mouth daily. (Patient not taking: Reported on 01/30/2017)  . traZODone (DESYREL) 50 MG tablet TAKE 1 TABLET BY MOUTH EVERY NIGHT AT BEDTIME AS NEEDED FOR SLEEP (Patient not taking: Reported on 01/31/2017)    FAMILY  HISTORY:  Her indicated that her mother is deceased. She indicated that her father is deceased. She indicated that both of her sisters are alive. She indicated that all of her five brothers are alive.    SOCIAL HISTORY: She  reports that she quit smoking about 13 months ago. Her smoking use included Cigarettes. She has a 12.50 pack-year smoking history. She has never used smokeless tobacco. She reports that she does not drink alcohol or use drugs.  REVIEW OF SYSTEMS:   Unable to obtain  SUBJECTIVE:   VITAL SIGNS: BP (!) 146/68   Pulse 64   Temp 98.9 F (37.2 C) (Axillary)   Resp (!) 30   Ht 5\' 2"  (1.575 m)   Wt 54.8 kg (120 lb 13 oz)   SpO2 100%   BMI 22.10 kg/m   HEMODYNAMICS:    VENTILATOR SETTINGS: Vent Mode: PRVC FiO2 (%):  [40 %] 40 % Set Rate:  [30 bmp] 30 bmp Vt Set:  [450 mL] 450 mL PEEP:  [5 cmH20] 5 cmH20 Plateau Pressure:  [22 cmH20-28 cmH20] 24 cmH20  INTAKE / OUTPUT: I/O last 3 completed shifts: In: 5002 [I.V.:4702; IV Piggyback:300] Out: 44 [Urine:205; Emesis/NG output:900]  PHYSICAL EXAMINATION: General:  Ill-appearing woman, mechanically ventilated Neuro:  Opened eyes to voice, attempted to track, grimace with stimulation, moved both upper extremities when perturbed HEENT:  ET tube in place, pupils equal Cardiovascular:  Tachycardic, regular, distant Lungs:  Clear bilaterally decreased at both bases right greater than left Abdomen:  Soft, nontender, hypoactive bowel sounds Musculoskeletal:  No deformities Skin:  No rash, feet are warm and well-perfused  LABS:  BMET  Recent Labs Lab 01/16/17 1427 01/16/17 2023 01/17/17 0257  NA 137 138 139  K 4.2 4.4 3.7  CL 107 105 105  CO2 17* 19* 19*  BUN 71* 70* 69*  CREATININE 3.86* 3.99* 4.18*  GLUCOSE 54* 82 102*    Electrolytes  Recent Labs Lab 01/16/17 0047  01/16/17 1427 01/16/17 2023 01/17/17 0257  CALCIUM 8.0*  < > 7.9* 8.0* 7.9*  MG 2.1  --   --   --   --   PHOS 6.5*  --   --    --   --   < > = values in this interval not displayed.  CBC  Recent Labs Lab 01/13/2017 1705 02/02/2017 1744 01/16/17 0452  WBC 15.4*  --  23.0*  HGB 7.8* 7.8* 7.8*  HCT 23.3* 23.0* 22.7*  PLT 430*  --  462*    Coag's  Recent Labs Lab 01/24/2017 1705  APTT 32  INR 1.25    Sepsis Markers  Recent Labs Lab 01/25/2017 2151 01/16/17 0047 01/16/17 0155  LATICACIDVEN 3.27* 2.2* 2.0*    ABG  Recent Labs Lab 01/26/2017 2012 01/16/17 0033 01/16/17 0430  PHART 7.551* 7.251* 7.188*  PCO2ART <15.0* 38.9 41.7  PO2ART 93.0 188.0* 183*    Liver Enzymes  Recent Labs Lab 01/04/2017 2145 01/16/17 0047  AST 123* 111*  ALT 48 50  ALKPHOS 138* 135*  BILITOT 0.5 1.1  ALBUMIN 2.3* 2.2*    Cardiac Enzymes  Recent Labs Lab 01/16/17 0047 01/16/17 0938  TROPONINI 0.09* 4.64*    Glucose  Recent Labs Lab 01/16/17 1549  01/16/17 1809 01/16/17 2019 01/16/17 2324 01/17/17 0314 01/17/17 0801  GLUCAP 47* 89 84 79 102* 99    Imaging No results found.   STUDIES:  Head CT (8/12):  1. Large metastasis in the superior aspect of the posterior right frontal lobe extending through the skull vertex, across midline and into the overlying scalp soft tissues. There is associated vasogenic edema in the adjacent right frontal lobe white matter. This would be amenable to percutaneous biopsy. 2. Additional metastases in the right and left cerebellar hemispheres. 3. No intracranial hemorrhage or mass effect. 4. Mild to moderate diffuse cerebral and cerebellar atrophy and mild chronic small vessel white matter ischemic changes in both cerebral hemispheres.  CT Chest/Abdomen/Pelvis (8/12):  1. Multiple lung metastases. 2. Moderate-sized right pleural effusion. This includes loculated pleural fluid superiorly, possibly due to pleural metastatic disease. 3. Small left pleural effusion. 4. Bilateral atelectasis, greater on the right. 5. Metastatic mediastinal adenopathy. 6.  Approximately 7.2 x 5.7 cm ill-defined area of low density in the posterior segment of the right lobe of the liver. This could represent the patient's known hepatocellular carcinoma, metastasis or geographical area of steatosis. 7. Minimal retrocrural and retroperitoneal adenopathy. This could be metastatic or reactive. 8. 8.2 cm left ovarian cyst. At this age, this is concerning for a possible ovarian neoplasm.  ECHO 3/53>>>GDJMEQ LV systolic function; moderate basal septal hypertrophy; mild diastolic dysfunction; sclerotic aortic valve with mild AI; mild MR; moderate TR with moderately elevated pulmonary pressure; large mass in RA and RV that appears to be invading the atrial and ventricular septum; findings suggestive of metastatic cardiac lesion;  CULTURES: MRSA screen 8/12 >> negative Blood 8/12 >>  Blood 8/13 >>  resp 8/12 >>   ANTIBIOTICS: Zosyn 8/12 >>   SIGNIFICANT EVENTS:  LINES/TUBES: ETT 8/12 >>   DISCUSSION: Acute respiratory failure, encephalopathy, and multifactorial shock. In light of her current workup her shock appears to be largely restrictive due to metastatic disease to her myocardium. She has been empirically treated for possible sepsis, hypovolemia as well. She is evolving progressive acute on chronic renal failure. Largest contributor to her overall decline is clearly metastatic disease. Unfortunately I do not see any other acute processes that we can impact to try and reverse current status, give her a meaningful opportunity to get back to her previous baseline.   I have explained my observations regarding her current status, active problems, prognosis with her son and daughter. I have supported their decision to defer any further escalation or invasive procedures. Their optimal goal would be for the patient to be awake enough, cognizant enough to give them guidance with regard to next steps in her care, specifically with regard to continued aggressive  care versus a transition to comfort. At the same time they clearly do not want to see her uncomfortable, would like to make sure that symptoms of discomfort are treated.   I have told them that attempts to interact with her would be reasonable, but that I suspect she would lack insight into the severity of her disease given her her critical illness, would likely be unable to answer such questions effectively. Also informed them that there is a balance between the ability for her to be awake enough to interact and managing her comfort. I believe they understood this.   They are going to try to interact with her meaningfully, ask some of their questions to her directly. They've also requested that we begin treating symptoms of discomfort. I suspect that  with further discussions and with time to process all the information regarding her status, her illness, that they will ultimately want to push for withdrawal of care. The timing here is unclear, still some hesitation. There is probably some pressure from other family members to continue aggressive care. One confounding issue that was shared by her children: Extended family does not know about the patient's malignancy, thus does not have the same appreciation for the irreversible nature of this illness. I offered our support with this issue and with all the issues that we will face as we go for forward. I expressed my confidence in the ICU staff and in Dr. Janit Pagan excellent care.   Independent CC time 90 minutes  Baltazar Apo, MD, PhD 01/17/2017, 12:11 PM [facility] [phone] or if no answer [phone]

## 2017-01-17 NOTE — Progress Notes (Signed)
Nutrition -- Brief Note  Chart reviewed due to ventilator status. No nutrition intervention indicated at this time.  Patient has a poor prognosis. No plans for escalation of care. Please consult if nutrition plan changes and TF indicated.  Molli Barrows, RD, LDN, [place] Pager [phone] After Hours Pager [phone]

## 2017-01-18 LAB — BASIC METABOLIC PANEL
ANION GAP: 15 (ref 5–15)
ANION GAP: 18 — AB (ref 5–15)
BUN: 75 mg/dL — AB (ref 6–20)
BUN: 79 mg/dL — ABNORMAL HIGH (ref 6–20)
CALCIUM: 7.9 mg/dL — AB (ref 8.9–10.3)
CALCIUM: 7.7 mg/dL — AB (ref 8.9–10.3)
CO2: 18 mmol/L — ABNORMAL LOW (ref 22–32)
CO2: 16 mmol/L — ABNORMAL LOW (ref 22–32)
CREATININE: 4.93 mg/dL — AB (ref 0.44–1.00)
Chloride: 103 mmol/L (ref 101–111)
Chloride: 103 mmol/L (ref 101–111)
Creatinine, Ser: 4.77 mg/dL — ABNORMAL HIGH (ref 0.44–1.00)
GFR calc Af Amer: 9 mL/min — ABNORMAL LOW (ref >60)
GFR calc non Af Amer: 8 mL/min — ABNORMAL LOW (ref >60)
GFR, EST AFRICAN AMERICAN: 9 mL/min — AB (ref >60)
GFR, EST NON AFRICAN AMERICAN: 8 mL/min — AB (ref >60)
GLUCOSE: 176 mg/dL — AB (ref 65–99)
Glucose, Bld: 184 mg/dL — ABNORMAL HIGH (ref 65–99)
Potassium: 3.7 mmol/L (ref 3.5–5.1)
Potassium: 4.2 mmol/L (ref 3.5–5.1)
SODIUM: 137 mmol/L (ref 135–145)
Sodium: 136 mmol/L (ref 135–145)

## 2017-01-18 LAB — GLUCOSE, CAPILLARY
GLUCOSE-CAPILLARY: 171 mg/dL — AB (ref 65–99)
GLUCOSE-CAPILLARY: 176 mg/dL — AB (ref 65–99)
GLUCOSE-CAPILLARY: 181 mg/dL — AB (ref 65–99)
GLUCOSE-CAPILLARY: 190 mg/dL — AB (ref 65–99)
Glucose-Capillary: 161 mg/dL — ABNORMAL HIGH (ref 65–99)
Glucose-Capillary: 164 mg/dL — ABNORMAL HIGH (ref 65–99)
Glucose-Capillary: 185 mg/dL — ABNORMAL HIGH (ref 65–99)

## 2017-01-18 MED ORDER — SENNOSIDES-DOCUSATE SODIUM 8.6-50 MG PO TABS
1.0000 | ORAL_TABLET | Freq: Two times a day (BID) | ORAL | Status: DC
  Administered 2017-01-18 (×2): 1
  Filled 2017-01-18 (×7): qty 1

## 2017-01-18 MED ORDER — FUROSEMIDE 10 MG/ML IJ SOLN
120.0000 mg | Freq: Once | INTRAMUSCULAR | Status: AC
  Administered 2017-01-18: 120 mg via INTRAVENOUS
  Filled 2017-01-18: qty 12

## 2017-01-18 MED ORDER — PROPOFOL 1000 MG/100ML IV EMUL
0.0000 ug/kg/min | INTRAVENOUS | Status: DC
  Administered 2017-01-18: 10 ug/kg/min via INTRAVENOUS
  Administered 2017-01-18: 15 ug/kg/min via INTRAVENOUS
  Filled 2017-01-18 (×2): qty 100

## 2017-01-18 NOTE — Progress Notes (Signed)
Fentanyl gtt 100 mls wasted in sink.  Cecil Cranker, RN witnessed  wasting of med. Will monitor.

## 2017-01-18 NOTE — Progress Notes (Signed)
eLink Physician-Brief Progress Note Patient Name: Letia Guidry DOB: February 17, 1941 MRN: [id number]   Date of Service  01/18/2017  HPI/Events of Note  Agitation - request to renew bilateral soft wrist restraints.  eICU Interventions  Will renew bilateral soft wrist restraints.      Intervention Category Minor Interventions: Agitation / anxiety - evaluation and management  Lysle Dingwall 01/18/2017, 8:41 PM

## 2017-01-18 NOTE — H&P (Signed)
PULMONARY / CRITICAL CARE MEDICINE   Name: Mosella Kasa MRN: [id number] DOB: 12-24-1940    ADMISSION DATE:  01/26/2017 CONSULTATION DATE:  01/16/17  REFERRING MD:  Dr Thomasene Lot (ER)  CHIEF COMPLAINT: Hypoxic respiratory failure, Shock  HISTORY OF PRESENT ILLNESS:   76yoF with CHF, CKD, Depression, DM, HCC, Anxiety (on chronic benzos), presented to the ER via EMS for SOB and Weakness. En-route to the ER she became unresponsive and hypotensive. Patient intubated on arrival to the ER. After intubation it was discovered that patient was DNR/DNI. She was found to be in shock (BP 63/47) and started on vasopressors peripherally. At the time of my exam, patient is waking up somewhat and grabbing for the ETT. Two family members are present and are trying to decide how aggressive they want [place] to be. I reviewed her current status and plan of care with them thus far.  Found with met cancer diffuse, brian , lung , heart  REVIEW OF SYSTEMS:   Review of Systems  Unable to perform ROS: Critical illness   Subjective:  Some agitation, treated  VITAL SIGNS: BP 109/69   Pulse (!) 59   Temp 98.2 F (36.8 C)   Resp (!) 22   Ht 5' 2"  (1.575 m)   Wt 56.4 kg (124 lb 5.4 oz)   SpO2 100%   BMI 22.74 kg/m   HEMODYNAMICS: Off neo  VENTILATOR SETTINGS: Vent Mode: PRVC FiO2 (%):  [40 %] 40 % Set Rate:  [22 bmp] 22 bmp Vt Set:  [450 mL] 450 mL PEEP:  [5 cmH20] 5 cmH20 Plateau Pressure:  [20 cmH20-21 cmH20] 21 cmH20  INTAKE / OUTPUT: I/O last 3 completed shifts: In: 2466.8 [I.V.:2216.8; IV Piggyback:250] Out: 2420 [Urine:390; Emesis/NG output:2030]  PHYSICAL EXAMINATION: General: on vent Neuro: rass -2, did FC HEENT: jvd wnl slight increased PULM: coarse CV: s1s2 RRR no r GI: soft, bs wnl, no  Extremities: edema increased    LABS:  BMET  Recent Labs Lab 01/17/17 0257 01/17/17 2105 01/18/17 0310  NA 139 134* 136  K 3.7 3.6 3.7  CL 105 101 103  CO2 19* 16* 18*  BUN 69* 73* 75*   CREATININE 4.18* 4.54* 4.77*  GLUCOSE 102* 154* 176*   Electrolytes  Recent Labs Lab 01/16/17 0047  01/17/17 0257 01/17/17 2105 01/18/17 0310  CALCIUM 8.0*  < > 7.9* 7.6* 7.9*  MG 2.1  --   --   --   --   PHOS 6.5*  --   --   --   --   < > = values in this interval not displayed. CBC  Recent Labs Lab 01/16/2017 1705 01/10/2017 1744 01/16/17 0452  WBC 15.4*  --  23.0*  HGB 7.8* 7.8* 7.8*  HCT 23.3* 23.0* 22.7*  PLT 430*  --  462*   Coag's  Recent Labs Lab 01/24/2017 1705  APTT 32  INR 1.25   Sepsis Markers  Recent Labs Lab 01/08/2017 2151 01/16/17 0047 01/16/17 0155  LATICACIDVEN 3.27* 2.2* 2.0*   ABG  Recent Labs Lab 01/16/2017 2012 01/16/17 0033 01/16/17 0430  PHART 7.551* 7.251* 7.188*  PCO2ART <15.0* 38.9 41.7  PO2ART 93.0 188.0* 183*   Liver Enzymes  Recent Labs Lab 01/13/2017 2145 01/16/17 0047  AST 123* 111*  ALT 48 50  ALKPHOS 138* 135*  BILITOT 0.5 1.1  ALBUMIN 2.3* 2.2*   Cardiac Enzymes  Recent Labs Lab 01/16/17 0047 01/16/17 0938  TROPONINI 0.09* 4.64*   Glucose  Recent Labs Lab 01/17/17  1120 01/17/17 1547 01/17/17 2021 01/18/17 0014 01/18/17 0415 01/18/17 0727  GLUCAP 100* 136* 156* 176* 181* 164*    Imaging No results found. STUDIES:  Head CT (8/12):  1. Large metastasis in the superior aspect of the posterior right frontal lobe extending through the skull vertex, across midline and into the overlying scalp soft tissues. There is associated vasogenic edema in the adjacent right frontal lobe white matter. This would be amenable to percutaneous biopsy. 2. Additional metastases in the right and left cerebellar hemispheres. 3. No intracranial hemorrhage or mass effect. 4. Mild to moderate diffuse cerebral and cerebellar atrophy and mild chronic small vessel white matter ischemic changes in both cerebral hemispheres.  CT Chest/Abdomen/Pelvis (8/12):  1. Multiple lung metastases. 2. Moderate-sized right pleural  effusion. This includes loculated pleural fluid superiorly, possibly due to pleural metastatic disease. 3. Small left pleural effusion. 4. Bilateral atelectasis, greater on the right. 5. Metastatic mediastinal adenopathy. 6. Approximately 7.2 x 5.7 cm ill-defined area of low density in the posterior segment of the right lobe of the liver. This could represent the patient's known hepatocellular carcinoma, metastasis or geographical area of steatosis. 7. Minimal retrocrural and retroperitoneal adenopathy. This could be metastatic or reactive. 8. 8.2 cm left ovarian cyst. At this age, this is concerning for a possible ovarian neoplasm.  ECHO 1/76>>>HYWVPX LV systolic function; moderate basal septal hypertrophy;   mild diastolic dysfunction; sclerotic aortic valve with mild AI;   mild MR; moderate TR with moderately elevated pulmonary pressure;   large mass in RA and RV that appears to be invading the atrial   and ventricular septum; findings suggestive of metastatic cardiac   lesion;   LINES/TUBES: 8/12 ett>>>   PULMONARY 1. Acute hypoxic respiratory failure; R- Pleural effusion; Atelectasis vs Pneumonia unlikely ; Pulmonary metastases -she has increased urine output, would favor some neg balance caution heart mass -wean today SBT cpap 5 ps5, goal 30 min , she is DNI with planned extubation -vent not benefiting her outcome -Upright position  CARDIOVASCULAR 2. Shock; CHF (EF 30-35% in 03/2016), stress ischemia and invasion cancer mets heart -lasix consideration  RENAL 3. CKD Stage V; Hyperkalemia NONAG - not candidate for HD and family not wish -lasix x 1 -chem in am   GASTROINTESTINAL Feed if not extubated ppi  HEMATOLOGIC 1. Hepatocellular Carcinoma: this was a known diagnosis, but family was not aware of any mets.   2. Ovarian cyst vs mass: at her age this is concerning to be malignancy and source of mets  3. 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 CT Chest shows multiple pulmonary mets. Unclear if this is metastasis from the liver or ovary. METS in heart present  -Sub q hep -sent today ca 125, alphafeto, blood for cytology  INFECTIOUS 1. Shock likely NOT sepsis, appears to be from obstructive -no infection present, dc all abx -no sepsis noted  ENDOCRINE 1. DM: no AI Decadron for brain mets - ssi  NEUROLOGIC 1. Acute encephalopathy:  2. Brain mets -WUA fent to goal 150 Start prop if needed -decadron , follows commands, keep  FAMILY  - Updated bye me daily. See prior discussions  35 min ccm time   Lavon Paganini. Titus Mould, MD, [place] Pgr: [place] [facility]

## 2017-01-19 LAB — GLUCOSE, CAPILLARY
GLUCOSE-CAPILLARY: 146 mg/dL — AB (ref 65–99)
GLUCOSE-CAPILLARY: 163 mg/dL — AB (ref 65–99)
Glucose-Capillary: 138 mg/dL — ABNORMAL HIGH (ref 65–99)

## 2017-01-19 LAB — BASIC METABOLIC PANEL
ANION GAP: 16 — AB (ref 5–15)
BUN: 87 mg/dL — ABNORMAL HIGH (ref 6–20)
CALCIUM: 7.5 mg/dL — AB (ref 8.9–10.3)
CO2: 18 mmol/L — AB (ref 22–32)
Chloride: 103 mmol/L (ref 101–111)
Creatinine, Ser: 5.07 mg/dL — ABNORMAL HIGH (ref 0.44–1.00)
GFR, EST AFRICAN AMERICAN: 9 mL/min — AB (ref >60)
GFR, EST NON AFRICAN AMERICAN: 7 mL/min — AB (ref >60)
Glucose, Bld: 150 mg/dL — ABNORMAL HIGH (ref 65–99)
POTASSIUM: 4.2 mmol/L (ref 3.5–5.1)
Sodium: 137 mmol/L (ref 135–145)

## 2017-01-19 LAB — AFP TUMOR MARKER: AFP, Serum, Tumor Marker: 21.2 ng/mL — ABNORMAL HIGH (ref 0.0–8.3)

## 2017-01-19 LAB — CA 125: CANCER ANTIGEN (CA) 125: 266.7 U/mL — AB (ref 0.0–38.1)

## 2017-01-19 MED ORDER — FUROSEMIDE 10 MG/ML IJ SOLN
160.0000 mg | Freq: Three times a day (TID) | INTRAVENOUS | Status: DC
  Filled 2017-01-19 (×2): qty 16

## 2017-01-19 MED ORDER — WHITE PETROLATUM GEL
Status: AC
  Administered 2017-01-19: 2
  Filled 2017-01-19: qty 1

## 2017-01-19 MED ORDER — FUROSEMIDE 10 MG/ML IJ SOLN
160.0000 mg | Freq: Three times a day (TID) | INTRAVENOUS | Status: AC
  Administered 2017-01-19 (×2): 160 mg via INTRAVENOUS
  Filled 2017-01-19: qty 16
  Filled 2017-01-19: qty 2

## 2017-01-19 MED ORDER — LABETALOL HCL 5 MG/ML IV SOLN
10.0000 mg | INTRAVENOUS | Status: DC | PRN
  Administered 2017-01-19: 10 mg via INTRAVENOUS
  Filled 2017-01-19: qty 4

## 2017-01-19 MED ORDER — POLYETHYLENE GLYCOL 3350 17 G PO PACK
17.0000 g | PACK | Freq: Every day | ORAL | Status: DC
  Administered 2017-01-21: 17 g
  Filled 2017-01-19 (×3): qty 1

## 2017-01-19 MED ORDER — FUROSEMIDE 10 MG/ML IJ SOLN
160.0000 mg | Freq: Two times a day (BID) | INTRAVENOUS | Status: DC
  Filled 2017-01-19: qty 16

## 2017-01-19 MED ORDER — MUPIROCIN 2 % EX OINT
TOPICAL_OINTMENT | CUTANEOUS | Status: AC
  Administered 2017-01-19: 1
  Filled 2017-01-19: qty 22

## 2017-01-19 NOTE — Progress Notes (Signed)
Sign out note of additional info  Pt was extubated and remains No code and no further initiation of any forms of life support Family also wants to limit sticks We will dc sub q hep, dc ssi cbg, dc blood work BUT We have limited lasix to 2 more doses, may continue this is we r to start checking bmets again Do no harm Their priority is continued to be comfort if fails and overall comfort Have consult pall care to discuss hospice Family wants continued medicine for now and to revisit this on daily basis  Lavon Paganini. Titus Mould, MD, [place] Pgr: [employer] [facility]

## 2017-01-19 NOTE — Procedures (Signed)
Extubation Procedure Note  Patient Details:   Name: Anna Gray DOB: [birth year] MRN: [id number]   Airway Documentation:     Evaluation  O2 sats: stable throughout Complications: No apparent complications Patient did tolerate procedure well. Bilateral Breath Sounds: Clear, Diminished   Yes  Carson Myrtle 01/19/2017, 9:17 AM

## 2017-01-19 NOTE — H&P (Signed)
PULMONARY / CRITICAL CARE MEDICINE   Name: Anna Gray MRN: [id number] DOB: 1941-01-31    ADMISSION DATE:  01/24/2017 CONSULTATION DATE:  01/16/17  REFERRING MD:  Dr Thomasene Lot (ER)  CHIEF COMPLAINT: Hypoxic respiratory failure, Shock  HISTORY OF PRESENT ILLNESS:   76yoF with CHF, CKD, Depression, DM, HCC, Anxiety (on chronic benzos), presented to the ER via EMS for SOB and Weakness. En-route to the ER she became unresponsive and hypotensive. Patient intubated on arrival to the ER. After intubation it was discovered that patient was DNR/DNI. She was found to be in shock (BP 63/47) and started on vasopressors peripherally. At the time of my exam, patient is waking up somewhat and grabbing for the ETT. Two family members are present and are trying to decide how aggressive they want [place] to be. I reviewed her current status and plan of care with them thus far.  Found with met cancer diffuse, brian , lung , heart  REVIEW OF SYSTEMS:   Review of Systems  Unable to perform ROS: Critical illness   Subjective:  More awake, more agitation,. Apnea on some weans  VITAL SIGNS: BP (!) 153/74   Pulse 91   Temp 97.7 F (36.5 C)   Resp 11   Ht 5' 2"  (1.575 m)   Wt 57.5 kg (126 lb 12.2 oz)   SpO2 100%   BMI 23.19 kg/m   HEMODYNAMICS: Off neo  VENTILATOR SETTINGS: Vent Mode: CPAP;PSV FiO2 (%):  [40 %] 40 % Set Rate:  [22 bmp] 22 bmp Vt Set:  [450 mL] 450 mL PEEP:  [5 cmH20] 5 cmH20 Pressure Support:  [5 cmH20] 5 cmH20 Plateau Pressure:  [18 cmH20-20 cmH20] 20 cmH20  INTAKE / OUTPUT: I/O last 3 completed shifts: In: 1135.7 [I.V.:935.7; IV Piggyback:200] Out: 742 [Urine:442; Emesis/NG output:300]  PHYSICAL EXAMINATION: General: fc, agitation distress Neuro: awake, fc, more agitated HEENT: jvd increased PULM: coarse CV:  s1 s2 RR VPaced GI: soft, bs wnl, some distention at baseline Extremities: edema, no rash     LABS:  BMET  Recent Labs Lab 01/18/17 0310 01/18/17 1503  01/19/17 0209  NA 136 137 137  K 3.7 4.2 4.2  CL 103 103 103  CO2 18* 16* 18*  BUN 75* 79* 87*  CREATININE 4.77* 4.93* 5.07*  GLUCOSE 176* 184* 150*   Electrolytes  Recent Labs Lab 01/16/17 0047  01/18/17 0310 01/18/17 1503 01/19/17 0209  CALCIUM 8.0*  < > 7.9* 7.7* 7.5*  MG 2.1  --   --   --   --   PHOS 6.5*  --   --   --   --   < > = values in this interval not displayed. CBC  Recent Labs Lab 01/27/2017 1705 01/25/2017 1744 01/16/17 0452  WBC 15.4*  --  23.0*  HGB 7.8* 7.8* 7.8*  HCT 23.3* 23.0* 22.7*  PLT 430*  --  462*   Coag's  Recent Labs Lab 01/17/2017 1705  APTT 32  INR 1.25   Sepsis Markers  Recent Labs Lab 01/31/2017 2151 01/16/17 0047 01/16/17 0155  LATICACIDVEN 3.27* 2.2* 2.0*   ABG  Recent Labs Lab 01/10/2017 2012 01/16/17 0033 01/16/17 0430  PHART 7.551* 7.251* 7.188*  PCO2ART <15.0* 38.9 41.7  PO2ART 93.0 188.0* 183*   Liver Enzymes  Recent Labs Lab 01/22/2017 2145 01/16/17 0047  AST 123* 111*  ALT 48 50  ALKPHOS 138* 135*  BILITOT 0.5 1.1  ALBUMIN 2.3* 2.2*   Cardiac Enzymes  Recent Labs Lab 01/16/17  0047 01/16/17 0938  TROPONINI 0.09* 4.64*   Glucose  Recent Labs Lab 01/18/17 1107 01/18/17 1532 01/18/17 2010 01/18/17 2339 01/19/17 0336 01/19/17 0800  GLUCAP 185* 190* 171* 161* 138* 146*    Imaging No results found. STUDIES:  Head CT (8/12):  1. Large metastasis in the superior aspect of the posterior right frontal lobe extending through the skull vertex, across midline and into the overlying scalp soft tissues. There is associated vasogenic edema in the adjacent right frontal lobe white matter. This would be amenable to percutaneous biopsy. 2. Additional metastases in the right and left cerebellar hemispheres. 3. No intracranial hemorrhage or mass effect. 4. Mild to moderate diffuse cerebral and cerebellar atrophy and mild chronic small vessel white matter ischemic changes in both cerebral hemispheres.  CT  Chest/Abdomen/Pelvis (8/12):  1. Multiple lung metastases. 2. Moderate-sized right pleural effusion. This includes loculated pleural fluid superiorly, possibly due to pleural metastatic disease. 3. Small left pleural effusion. 4. Bilateral atelectasis, greater on the right. 5. Metastatic mediastinal adenopathy. 6. Approximately 7.2 x 5.7 cm ill-defined area of low density in the posterior segment of the right lobe of the liver. This could represent the patient's known hepatocellular carcinoma, metastasis or geographical area of steatosis. 7. Minimal retrocrural and retroperitoneal adenopathy. This could be metastatic or reactive. 8. 8.2 cm left ovarian cyst. At this age, this is concerning for a possible ovarian neoplasm.  ECHO 4/40>>>HKVQQV LV systolic function; moderate basal septal hypertrophy;   mild diastolic dysfunction; sclerotic aortic valve with mild AI;   mild MR; moderate TR with moderately elevated pulmonary pressure;   large mass in RA and RV that appears to be invading the atrial   and ventricular septum; findings suggestive of metastatic cardiac   lesion;   LINES/TUBES: 8/12 ett>>>   PULMONARY 1. Acute hypoxic respiratory failure; R- Pleural effusion; Atelectasis vs Pneumonia unlikely ; Pulmonary metastases -some apnea now improved on wean cpap 5 ps5 -DNI with panned extubation -consider further neg balance, was even overall last 24 hours -NO role thora  CARDIOVASCULAR 2. Shock; CHF (EF 30-35% in 03/2016), stress ischemia and invasion cancer mets heart -lasix to 160 to tid chem in am   RENAL 3. CKD Stage V; Hyperkalemia NONAG - not candidate for HD and family not wish -lasix tid 160, assess repsonse -chem in am   GASTROINTESTINAL Feed if not extubated today ppi  HEMATOLOGIC 1. Hepatocellular Carcinoma: this was a known diagnosis, but family was not aware of any mets.   2. Ovarian cyst vs mass: at her age this is concerning to be malignancy and  source of mets  3. 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 CT Chest shows multiple pulmonary mets. Unclear if this is metastasis from the liver or ovary. METS in heart present  -Sub q hep -sent today ca 125 - 266, alphafeto 22, blood for cytology Likely has ovarian involvement with presentation>? Will d/w family use of [place] trasnvaginal  INFECTIOUS 1. Shock likely NOT sepsis, appears to be from obstructive -no infection present, dc all abx  ENDOCRINE 1. DM: no AI Decadron for brain mets has made major improvement, reduce in am likely  - ssi needed  -we r in [facility] trial  NEUROLOGIC 1. Acute encephalopathy:  2. Brain mets -WUA fent off, int fent -prop with WUA  -decadron , follows commands, keep and reduce in am   FAMILY  - Updated bye me daily. DNI established  35 min ccm time  Lavon Paganini. Titus Mould, MD, [place] Pgr: [place] [facility]

## 2017-01-19 NOTE — Progress Notes (Signed)
Family informed of pt transfer to 5W30.

## 2017-01-20 DIAGNOSIS — Z7189 Other specified counseling: Secondary | ICD-10-CM

## 2017-01-20 DIAGNOSIS — R52 Pain, unspecified: Secondary | ICD-10-CM

## 2017-01-20 DIAGNOSIS — Z515 Encounter for palliative care: Secondary | ICD-10-CM

## 2017-01-20 LAB — GLUCOSE, CAPILLARY
GLUCOSE-CAPILLARY: 152 mg/dL — AB (ref 65–99)
GLUCOSE-CAPILLARY: 156 mg/dL — AB (ref 65–99)
GLUCOSE-CAPILLARY: 161 mg/dL — AB (ref 65–99)
GLUCOSE-CAPILLARY: 204 mg/dL — AB (ref 65–99)
Glucose-Capillary: 169 mg/dL — ABNORMAL HIGH (ref 65–99)
Glucose-Capillary: 179 mg/dL — ABNORMAL HIGH (ref 65–99)

## 2017-01-20 MED ORDER — HYDROMORPHONE HCL 1 MG/ML IJ SOLN
0.5000 mg | INTRAMUSCULAR | Status: DC | PRN
  Administered 2017-01-20 – 2017-01-21 (×3): 0.5 mg via INTRAVENOUS
  Filled 2017-01-20 (×4): qty 1

## 2017-01-20 MED ORDER — CHLORHEXIDINE GLUCONATE 0.12 % MT SOLN
OROMUCOSAL | Status: AC
  Filled 2017-01-20: qty 15

## 2017-01-20 MED ORDER — HYDROMORPHONE HCL 1 MG/ML IJ SOLN: 0.5000 mg | INTRAMUSCULAR | Status: DC | PRN

## 2017-01-20 MED ORDER — OXYCODONE HCL 20 MG/ML PO CONC: 10.0000 mg | ORAL | Status: DC | PRN

## 2017-01-20 MED ORDER — SODIUM CHLORIDE 0.9 % IV SOLN
0.5000 mg/h | INTRAVENOUS | Status: DC
  Filled 2017-01-20: qty 5

## 2017-01-20 NOTE — Progress Notes (Signed)
PT Cancellation Note  Patient Details Name: Suriya Kovarik MRN: [id number] DOB: [birth year]   Cancelled Treatment:    Reason Eval/Treat Not Completed: Other (comment);PT screened, no needs identified, will sign off. Palliative has consulted and family is deciding between [facility] and SNF or palliative care. No PT need are required at this time. Please re-order if any new needs arise.    Scheryl Marten PT, DPT  [phone]  01/20/2017, 2:42 PM

## 2017-01-20 NOTE — Consult Note (Signed)
.                                                                                    Consultation Note Date: 01/20/2017   Patient Name: Anna Gray  DOB: Dec 11, 1940  MRN: [id number]  Age / Sex: 76 y.o., female  PCP: Everrett Coombe, MD Referring Physician: Rosita Fire, MD   VERY PRIVATE PERSON - please only discuss her condition with the patient herself, her son, and her daughter.  Ask who the people are in the room.  Thanks.  Reason for Consultation: Establishing goals of care  HPI/Patient Profile: 76 y.o. female  with past medical history of liver cancer, DM, CKD, CHF s/p pacemaker, chronic anxiety who was admitted on 01/20/2017 with hypoxic respiratory failure and shock. She was intubated and admitted.  Work up revealed metastatic cancer of uncertain origin to the brain, lung, retroperitoneum, and heart (RA and RV).  Her CA-125 is elevated.  She has since been extubated and weaned to nasal canula.  PMT was consulted for goals of care and possible hospice evaluation  Clinical Assessment and Goals of Care:  I have reviewed medical records including EPIC notes, labs and imaging, received report from the care team, assessed the patient and then met at the bedside along with patient, son, dtr, and patient's sister  to discuss diagnosis prognosis, GOC, EOL wishes, disposition and options.  I introduced Palliative Medicine as specialized medical care for people living with serious illness. It focuses on providing relief from the symptoms and stress of a serious illness. The goal is to improve quality of life for both the patient and the family.  We discussed a brief life review of the patient. She was born in [place] but moved to [place] at 76 yo.  She always was happiest living in [place].  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  Her son Raquel Sarna is from [place] and her daughter, Philippa Sicks works in [place] with [employer].     She has been  battling CHF and [place] for over a year.  She was just in [place] last week (in [place]).  The Son/Dtr were told there are not too many places where the cancer is not.  At discharge she was brought by ambulance to [facility] to be near her dtr. (2 weeks ago).    During this admission the family was surprised to learn that the cancer has spread to her brain and thru her skull.  We reviewed the patient's CT scans together.  We discussed the patient's very limited PO intake.  The family responded that she is hungry but hasn't received a meal yet.  They felt that given the opportunity the patient will eat.  I spoke with Shawn and Ralanda as the patient was sleeping.  If the patient is unable to make her own health care decisions, then Raquel Sarna and Philippa Sicks will make the decisions together.  Shawn is comfort orientated.  He feels his mother is suffering and sending her to rehab would be causing more suffering.  Ralanda greatly wants to support whatever her mother wants.  She does not want her mother to suffer but feels it is important for her mothers wishes to be heard and respected.   Mrs. Zwart was sleeping today and unable to participate in the conversation.    Interestingly the daughter commented that her mother fears death.  I asked why but she was unable to tell me.  I told her it may be helpful to find out why - as it may be something we can help resolve.  While the patient is DNR and aggressive care is being reduced - she is not yet "full comfort". Ralanda is worried that going full comfort will hasten her mothers death.  We discussed what to do if her mother had an acute event (such as aspiration) would she want [place] to make her comfortable even if giving her comfort meds may shorten her life.  Ralanda was unable to make a decision.  We discussed SNF vs Hospice at length.  The end result was that the children will support their mother's wishes.  I advised Ralanda and Shawn to visit [facility].  They are familiar with [place] would give them a point of comparison.    I presented them with Hard Cho[facility] Booklets.  Hospice and Palliative Care services outpatient were explained and offered.  PMT will attempt to follow up as time allows over the weekend. The family was encouraged to call with questions or concerns.   Primary Decision Maker:  NEXT OF KIN  Daughter and son  Philippa Sicks and [place])    SUMMARY OF RECOMMENDATIONS    DNR/DNI  Do not discuss patient information with anyone outside of the Patient, Ralanda, and Shawn  Fentanyl PRN pain, dyspnea  Follow up is needed to determine SNF  vs Hospice House.  Code Status/Advance Care Planning:  DNR  Palliative Prophylaxis:   Frequent Pain Assessment, Oral Care and Turn Reposition  Psycho-social/Spiritual:   Desire for further Chaplaincy support:  She is spiritual, but was too exhausted for a chaplain visit  Prognosis: very poor.  Days to weeks in the setting of minimal PO intake.  Unable to walk or care for herself (she last walked 3 weeks ago), widespread metastatic cancer to the liver, lungs, retroperitoneum, heart, and brain as well as CHF and CKD.    Discharge Planning: To Be Determined      Primary Diagnoses: Present on Admission: . Acute respiratory failure with hypoxia ([place])   I have reviewed the medical record, interviewed the patient and family, and examined the patient. The following aspects are pertinent.  Past Medical History:  Diagnosis Date  . Anxiety   . CHF (congestive heart failure) ([place])   . Chronic kidney disease   . Depression   . Diabetes mellitus without complication ([place])   . Hepatocellular carcinoma ([place])   . Hypertension   . Presence of permanent cardiac pacemaker    Social History   Social History  . Marital status: Divorced    Spouse name: N/A  . Number of children: N/A  . Years of education: N/A   Social History Main Topics  . Smoking  status: Former Smoker    Packs/day: 0.50    Years: 25.00    Types: Cigarettes    Quit date: 12/11/2015  . Smokeless tobacco: Never Used     Comment: hasn't smoked in past 4 months  . Alcohol use No  . Drug use: No  . Sexual activity: No   Other Topics Concern  .  None   Social History Narrative  . None   Family History  Problem Relation Age of Onset  . Other Mother        Car accident  . Hypertension Mother   . Other Father        Car accident  . Hypertension Sister    Scheduled Meds: . chlorhexidine gluconate (MEDLINE KIT)  15 mL Mouth Rinse BID  . dexamethasone  10 mg Intravenous Q6H  . mouth rinse  15 mL Mouth Rinse QID  . polyethylene glycol  17 g Per Tube Daily  . senna-docusate  1 tablet Per Tube BID   Continuous Infusions: . sodium chloride    . sodium chloride 10 mL/hr at 01/19/17 2000  . famotidine (PEPCID) IV Stopped (01/19/17 1030)   PRN Meds:.sodium chloride, acetaminophen, docusate, fentaNYL (SUBLIMAZE) injection, labetalol, ondansetron (ZOFRAN) IV Allergies  Allergen Reactions  . Vicodin [Hydrocodone-Acetaminophen] Nausea And Vomiting   Review of Systems "I hurt all over"  Patient too lethargic to give more information  Physical Exam  Thin, well developed female, appears ill.  Lethargic lying in bed CV tachy resp no distress, shallow Abdomen thin, soft, non distended Ext no edema  Vital Signs: BP (!) 142/72 (BP Location: Left Arm)   Pulse (!) 107   Temp 98.6 F (37 C)   Resp 18   Ht 5' 2"  (1.575 m)   Wt 56.1 kg (123 lb 10.9 oz)   SpO2 100%   BMI 22.62 kg/m  Pain Assessment: No/denies pain   Pain Score: 0-No pain   SpO2: SpO2: 100 % O2 Device:SpO2: 100 % O2 Flow Rate: .O2 Flow Rate (L/min): 4 L/min  IO: Intake/output summary:  Intake/Output Summary (Last 24 hours) at 01/20/17 1041 Last data filed at 01/20/17 4481  Gross per 24 hour  Intake              100 ml  Output              350 ml  Net             -250 ml    LBM: Last BM  Date: 01/16/17 Baseline Weight: Weight: 49.8 kg (109 lb 12.6 oz) Most recent weight: Weight: 56.1 kg (123 lb 10.9 oz)     Palliative Assessment/Data:   Flowsheet Rows     Most Recent Value  Intake Tab  Referral Department  Hospitalist  Unit at Time of Referral  Cardiac/Telemetry Unit  Palliative Care Primary Diagnosis  Cancer  Date Notified  01/20/17  Palliative Care Type  New Palliative care  Reason for referral  Clarify Goals of Care  Date of Admission  01/25/2017  Date first seen by Palliative Care  01/20/17  # of days Palliative referral response time  0 Day(s)  # of days IP prior to Palliative referral  5  Clinical Assessment  Palliative Performance Scale Score  20%  Psychosocial & Spiritual Assessment  Palliative Care Outcomes  Patient/Family meeting held?  Yes  Who was at the meeting?  patient, sister, daughter, son  Palliative Care Outcomes  Clarified goals of care      Time In:  10:30 Time Out: 12:00 Time Total: 90  min. Greater than 50%  of this time was spent counseling and coordinating care related to the above assessment and plan.  Signed by: Florentina Jenny, PA-C Palliative Medicine Pager: [phone]  Please contact Palliative Medicine Team phone at [phone] for questions and concerns.  For individual provider: See Shea Evans

## 2017-01-20 NOTE — Evaluation (Signed)
Clinical/Bedside Swallow Evaluation Patient Details  Name: Anna Gray MRN: [id number] Date of Birth: 23-Mar-1941  Today's Date: 01/20/2017 Time: SLP Start Time (ACUTE ONLY): 1118 SLP Stop Time (ACUTE ONLY): 1133 SLP Time Calculation (min) (ACUTE ONLY): 15 min  Past Medical History:  Past Medical History:  Diagnosis Date  . Anxiety   . CHF (congestive heart failure) ([place])   . Chronic kidney disease   . Depression   . Diabetes mellitus without complication ([place])   . Hepatocellular carcinoma ([place])   . Hypertension   . Presence of permanent cardiac pacemaker    Past Surgical History:  Past Surgical History:  Procedure Laterality Date  . embolization     Chemoembolization of HCC  . INSERT / REPLACE / REMOVE PACEMAKER     HPI:  76 yo F with CHF, CKD, Depression, DM, HCC, Anxiety (on chronic benzos), presented to the ER via EMS for SOB and Weakness. En-route to the ER she became unresponsive and hypotensive. Patient intubated on arrival to the ER 8/12-8/16. It was discovered that patient was DNR/DNI. She was found to be in shock (BP 63/47) and started on vasopressors peripherally and met cancer diffuse, brian , lung, heart   Assessment / Plan / Recommendation Clinical Impression  Pt appears weak and significantly deconditioned placing her at risk for aspiration however no specific/overt s/s aspiration observed during this assessment. Mild dyspnea that increased during assessment. Prolonged mastication with solid. Educated family to ensure upright position, po's when adequately alert, slow rate with Dys 1 (puree) texture, thin liquids; straw allowed, crush pills and check oral cavity for pocketed food.    SLP Visit Diagnosis: Dysphagia, oral phase (R13.11)    Aspiration Risk  Moderate aspiration risk    Diet Recommendation Dysphagia 1 (Puree);Thin liquid   Liquid Administration via: Cup;Straw Medication Administration: Crushed with puree Supervision: Patient able to self feed;Staff  to assist with self feeding;Full supervision/cueing for compensatory strategies Compensations: Slow rate;Small sips/bites;Minimize environmental distractions;Lingual sweep for clearance of pocketing Postural Changes: Seated upright at 90 degrees    Other  Recommendations Oral Care Recommendations: Oral care BID   Follow up Recommendations None      Frequency and Duration min 1 x/week  2 weeks       Prognosis Prognosis for Safe Diet Advancement: Fair      Swallow Study   General HPI: 76 yo F with CHF, CKD, Depression, DM, HCC, Anxiety (on chronic benzos), presented to the ER via EMS for SOB and Weakness. En-route to the ER she became unresponsive and hypotensive. Patient intubated on arrival to the ER 8/12-8/16. It was discovered that patient was DNR/DNI. She was found to be in shock (BP 63/47) and started on vasopressors peripherally and met cancer diffuse, brian , lung, heart Type of Study: Bedside Swallow Evaluation Previous Swallow Assessment:  (none) Diet Prior to this Study: NPO Temperature Spikes Noted: No Respiratory Status: Nasal cannula History of Recent Intubation: Yes Length of Intubations (days): 5 days Date extubated: 01/19/17 Behavior/Cognition: Cooperative;Requires cueing;Lethargic/Drowsy (adequately awake) Oral Cavity Assessment: Within Functional Limits Oral Care Completed by SLP: No Oral Cavity - Dentition: Missing dentition;Poor condition (natural lower, upper plate not present. ) Vision: Functional for self-feeding Self-Feeding Abilities: Needs assist Patient Positioning: Upright in bed Baseline Vocal Quality: Low vocal intensity;Hoarse Volitional Cough: Weak Volitional Swallow: Able to elicit    Oral/Motor/Sensory Function Overall Oral Motor/Sensory Function: Generalized oral weakness   Ice Chips Ice chips: Not tested   Thin Liquid Thin Liquid: Impaired Presentation: Cup;Straw  Pharyngeal  Phase Impairments: Suspected delayed Swallow    Nectar Thick  Nectar Thick Liquid: Not tested   Honey Thick Honey Thick Liquid: Not tested   Puree Puree: Within functional limits   Solid   GO   Solid: Impaired Oral Phase Impairments: Reduced lingual movement/coordination Oral Phase Functional Implications: Prolonged oral transit        Anna Gray 01/20/2017,3:14 PM  Anna Gray [place] [phone]

## 2017-01-20 NOTE — Care Management Important Message (Signed)
Important Message  Patient Details  Name: Anna Gray MRN: [id number] Date of Birth: Oct 15, 1940   Medicare Important Message Given:  Yes    [name] 01/20/2017, 11:21 AM

## 2017-01-20 NOTE — Progress Notes (Addendum)
PROGRESS NOTE    Anna Gray  ZSW:[id number] DOB: Jan 21, 1941 DOA: 01/26/2017 PCP: Everrett Coombe, MD   Brief Narrative: [phone] with CHF, CKD, Depression, DM, HCC, Anxiety (on chronic benzos), presented to the ER via EMS for SOB and Weakness. En-route to the ER she became unresponsive and hypotensive. Patient intubated on arrival to the ER. After intubation it was discovered that patient was DNR/DNI. She was found to be in shock (BP 63/47) and started on vasopressors peripherally.Found with met cancer diffuse, brian , lung , heart. Patient was extubated, made comfort measures and transferred to the floor by PCCM.  Palliative care consulted.   Assessment & Plan:  # Acute respiratory failure with hypoxia due to right pleural effusion, atelectasis and pulmonary metastasis: required intubation: Patient is DO NOT INTUBATE, extubated. Currently on nasal cannula oxygen.   # Likely cardiogenic shock: Blood pressure improved. Off pressors. Lasix discontinued by PCCM  #Chronic kidney disease status 5 with hyperkalemia on admission: Not a candidate for hemodialysis. Patient was nothing by mouth until swallow evaluation.  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 CT chest with multiple pulmonary metastases. Continue Decadron. Large Mass in right atrium and right ventricle that appears to be invading the atrium and ventricular septum findings suggestive of diastolic cardiac lesions  # Pain management: On fentanyl IV every 2 hours as needed. Discussed with the nursing staffs regarding pain management  #Severe protein calorie malnutrition: Dietary and swallow evaluation.  # Goals of care discussion: Patient is DNR/DNI and currently on comfort measures. Palliative care was already consulted. I had  goal of care discussion with the patient's daughter at bedside at  length today. Patient with kidney failure, widespread metastatic malignancy including brain and heart. The daughter agreed with comfort measures and making sure that her mother is pain free. No aggressive measures. Follow-up palliative care consult's plan. Patient will likely benefit from hospice care on discharge. Continue current management.  DVT prophylaxis: SCD Code Status: DNR/DNI Family Communication: Discussed with the patient's daughter at bedside Disposition Plan: Possibly discharge tomorrow hospice care service in 1-2 days    Consultants:   PCCM transfer  Palliative care  Procedures: None Antimicrobials: None currently  Subjective: Seen and examined at bedside. Reported abdominal pain. Denied nausea vomiting chest pain or shortness of breath.  Objective: Vitals:   01/20/17 0411 01/20/17 0615 01/20/17 0625 01/20/17 1243  BP:  (!) 142/72  (!) 162/77  Pulse: 90 (!) 115 (!) 107 87  Resp:  18  14  Temp:  98.6 F (37 C)    TempSrc:      SpO2: 96% 97% 100% 98%  Weight:  56.1 kg (123 lb 10.9 oz)    Height:        Intake/Output Summary (Last 24 hours) at 01/20/17 1257 Last data filed at 01/20/17 0658  Gross per 24 hour  Intake               80 ml  Output              350 ml  Net             -270 ml   Filed Weights   01/18/17 0258 01/19/17 0500 01/20/17 0615  Weight: 56.4 kg (124 lb 5.4 oz) 57.5 kg (126 lb 12.2 oz) 56.1 kg (123 lb 10.9 oz)    Examination:  General exam: Ill-looking female lying  on bed complaining of abdominal discomfort Respiratory system: Clear to auscultation. Respiratory effort normal. No wheezing or crackle Cardiovascular system: S1 & S2 heard, RRR.  No pedal edema. Gastrointestinal system: Abdomen soft, diffuse tender. Bowel sound positive Central nervous system: Alert awake and following commands Extremities: Symmetric 5 x 5 power. Skin: No rashes, lesions or ulcers    Data Reviewed: I have personally reviewed following labs and  imaging studies  CBC:  Recent Labs Lab 02/02/2017 1705 01/21/2017 1744 01/16/17 0452  WBC 15.4*  --  23.0*  NEUTROABS 11.5*  --   --   HGB 7.8* 7.8* 7.8*  HCT 23.3* 23.0* 22.7*  MCV 73.5*  --  71.2*  PLT 430*  --  262*   Basic Metabolic Panel:  Recent Labs Lab 01/16/17 0047  01/17/17 0257 01/17/17 2105 01/18/17 0310 01/18/17 1503 01/19/17 0209  NA 132*  < > 139 134* 136 137 137  K 5.9*  < > 3.7 3.6 3.7 4.2 4.2  CL 106  < > 105 101 103 103 103  CO2 15*  < > 19* 16* 18* 16* 18*  GLUCOSE 498*  < > 102* 154* 176* 184* 150*  BUN 73*  < > 69* 73* 75* 79* 87*  CREATININE 3.81*  < > 4.18* 4.54* 4.77* 4.93* 5.07*  CALCIUM 8.0*  < > 7.9* 7.6* 7.9* 7.7* 7.5*  MG 2.1  --   --   --   --   --   --   PHOS 6.5*  --   --   --   --   --   --   < > = values in this interval not displayed. GFR: Estimated Creatinine Clearance: 7.5 mL/min (A) (by C-G formula based on SCr of 5.07 mg/dL (H)). Liver Function Tests:  Recent Labs Lab 01/09/2017 2145 01/16/17 0047  AST 123* 111*  ALT 48 50  ALKPHOS 138* 135*  BILITOT 0.5 1.1  PROT 6.2* 6.4*  ALBUMIN 2.3* 2.2*   No results for input(s): LIPASE, AMYLASE in the last 168 hours. No results for input(s): AMMONIA in the last 168 hours. Coagulation Profile:  Recent Labs Lab 01/11/2017 1705  INR 1.25   Cardiac Enzymes:  Recent Labs Lab 01/16/17 0047 01/16/17 0938  TROPONINI 0.09* 4.64*   BNP (last 3 results) No results for input(s): PROBNP in the last 8760 hours. HbA1C: No results for input(s): HGBA1C in the last 72 hours. CBG:  Recent Labs Lab 01/19/17 2021 01/20/17 0012 01/20/17 0409 01/20/17 0850 01/20/17 1241  GLUCAP 163* 152* 169* 156* 161*   Lipid Profile: No results for input(s): CHOL, HDL, LDLCALC, TRIG, CHOLHDL, LDLDIRECT in the last 72 hours. Thyroid Function Tests: No results for input(s): TSH, T4TOTAL, FREET4, T3FREE, THYROIDAB in the last 72 hours. Anemia Panel: No results for input(s): VITAMINB12, FOLATE,  FERRITIN, TIBC, IRON, RETICCTPCT in the last 72 hours. Sepsis Labs:  Recent Labs Lab 02/03/2017 1721 01/08/2017 2151 01/16/17 0047 01/16/17 0155  LATICACIDVEN 7.50* 3.27* 2.2* 2.0*    Recent Results (from the past 240 hour(s))  Culture, blood (x 2)     Status: None (Preliminary result)   Collection Time: 01/16/17 12:35 AM  Result Value Ref Range Status   Specimen Description BLOOD RIGHT ANTECUBITAL  Final   Special Requests   Final    BOTTLES DRAWN AEROBIC AND ANAEROBIC Blood Culture adequate volume   Culture NO GROWTH 3 DAYS  Final   Report Status PENDING  Incomplete  Culture, blood (routine x 2)  Status: None (Preliminary result)   Collection Time: 01/16/17 12:47 AM  Result Value Ref Range Status   Specimen Description BLOOD RIGHT HAND  Final   Special Requests   Final    BOTTLES DRAWN AEROBIC AND ANAEROBIC Blood Culture adequate volume   Culture NO GROWTH 3 DAYS  Final   Report Status PENDING  Incomplete  MRSA PCR Screening     Status: None   Collection Time: 01/16/17  4:40 AM  Result Value Ref Range Status   MRSA by PCR NEGATIVE NEGATIVE Final    Comment:        The GeneXpert MRSA Assay (FDA approved for NASAL specimens only), is one component of a comprehensive MRSA colonization surveillance program. It is not intended to diagnose MRSA infection nor to guide or monitor treatment for MRSA infections.          Radiology Studies: No results found.      Scheduled Meds: . chlorhexidine gluconate (MEDLINE KIT)  15 mL Mouth Rinse BID  . dexamethasone  10 mg Intravenous Q6H  . mouth rinse  15 mL Mouth Rinse QID  . polyethylene glycol  17 g Per Tube Daily  . senna-docusate  1 tablet Per Tube BID   Continuous Infusions: . sodium chloride    . sodium chloride 10 mL/hr at 01/19/17 2000  . famotidine (PEPCID) IV 20 mg (01/20/17 1100)     LOS: 5 days    Coyt Govoni Tanna Furry, MD [facility] Pager [phone]  If 7PM-7AM, please contact  night-coverage [email or web address] Password [facility] 01/20/2017, 12:57 PM

## 2017-01-21 LAB — CULTURE, BLOOD (ROUTINE X 2)
CULTURE: NO GROWTH
CULTURE: NO GROWTH
SPECIAL REQUESTS: ADEQUATE
Special Requests: ADEQUATE

## 2017-01-21 LAB — GLUCOSE, CAPILLARY
GLUCOSE-CAPILLARY: 194 mg/dL — AB (ref 65–99)
GLUCOSE-CAPILLARY: 231 mg/dL — AB (ref 65–99)
GLUCOSE-CAPILLARY: 234 mg/dL — AB (ref 65–99)
GLUCOSE-CAPILLARY: 225 mg/dL — AB (ref 65–99)
Glucose-Capillary: 208 mg/dL — ABNORMAL HIGH (ref 65–99)

## 2017-01-21 MED ORDER — LORAZEPAM 2 MG/ML IJ SOLN
0.5000 mg | INTRAMUSCULAR | Status: DC | PRN
  Administered 2017-01-21: 0.5 mg via INTRAVENOUS
  Filled 2017-01-21: qty 1

## 2017-01-21 MED ORDER — HYDROMORPHONE HCL 1 MG/ML IJ SOLN
1.0000 mg | INTRAMUSCULAR | Status: DC | PRN
  Administered 2017-01-21: 1 mg via INTRAVENOUS
  Filled 2017-01-21: qty 1

## 2017-01-21 MED ORDER — HYDROMORPHONE HCL 1 MG/ML IJ SOLN
0.5000 mg | Freq: Once | INTRAMUSCULAR | Status: AC
  Administered 2017-01-21: 0.5 mg via INTRAVENOUS
  Filled 2017-01-21: qty 1

## 2017-01-21 MED ORDER — HYDROMORPHONE HCL 1 MG/ML IJ SOLN
0.5000 mg | INTRAMUSCULAR | Status: DC | PRN
  Administered 2017-01-21 – 2017-01-22 (×3): 1 mg via INTRAVENOUS
  Filled 2017-01-21 (×3): qty 1

## 2017-01-21 MED ORDER — BISACODYL 10 MG RE SUPP
10.0000 mg | RECTAL | Status: DC
  Filled 2017-01-21: qty 1

## 2017-01-21 MED ORDER — MORPHINE SULFATE (CONCENTRATE) 10 MG/0.5ML PO SOLN: 10.0000 mg | Freq: Three times a day (TID) | ORAL | Status: DC

## 2017-01-21 MED ORDER — HYDROMORPHONE HCL 1 MG/ML IJ SOLN: 2.0000 mg | INTRAMUSCULAR | Status: DC | PRN

## 2017-01-21 MED ORDER — DEXAMETHASONE SODIUM PHOSPHATE 10 MG/ML IJ SOLN
10.0000 mg | Freq: Three times a day (TID) | INTRAMUSCULAR | Status: DC
  Administered 2017-01-21 – 2017-01-22 (×4): 10 mg via INTRAVENOUS
  Filled 2017-01-21 (×5): qty 1

## 2017-01-21 MED ORDER — LORAZEPAM 2 MG/ML IJ SOLN
0.5000 mg | INTRAMUSCULAR | Status: DC | PRN
  Administered 2017-01-21 – 2017-01-22 (×4): 1 mg via INTRAVENOUS
  Filled 2017-01-21 (×4): qty 1

## 2017-01-21 MED ORDER — HYDROMORPHONE HCL 1 MG/ML IJ SOLN: 1.0000 mg | INTRAMUSCULAR | Status: DC | PRN

## 2017-01-21 NOTE — Progress Notes (Addendum)
PROGRESS NOTE    Zephaniah Enyeart  TIR:[id number] DOB: [birth year] DOA: 01/17/2017 PCP: Everrett Coombe, MD   Brief Narrative: [phone] with CHF, CKD, Depression, DM, HCC, Anxiety (on chronic benzos), presented to the ER via EMS for SOB and Weakness. En-route to the ER she became unresponsive and hypotensive. Patient intubated on arrival to the ER. After intubation it was discovered that patient was DNR/DNI. She was found to be in shock (BP 63/47) and started on vasopressors peripherally.Found with met cancer diffuse, brian , lung , heart. Patient was extubated, made comfort measures and transferred to the floor by PCCM.  Palliative care consulted.   Assessment & Plan:  # Acute respiratory failure with hypoxia due to right pleural effusion, atelectasis and pulmonary metastasis: required intubation: Patient is DO NOT INTUBATE, extubated. Currently on nasal cannula oxygen.   # Likely cardiogenic shock: Blood pressure improved. Off pressors. Lasix discontinued by PCCM  #Chronic kidney disease status 5 with hyperkalemia on admission: Not a candidate for hemodialysis.   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 CT chest with multiple pulmonary metastases. Continue Decadron. Large Mass in right atrium and right ventricle that appears to be invading the atrium and ventricular septum findings suggestive of diastolic cardiac lesions  # Pain management: Patient looks uncomfortable with severe pain. I started concentrated oral morphine 10 mg tid and increased the IV breakthrough pain medications. Added Ativan as needed for anxiety. Continue bowel regimen.  #Severe protein calorie malnutrition: Dietary and swallow evaluation. On dysphagia diet  # Goals of care discussion: Patient is DNR/DNI. I had a long discussion with the patient's son and daughter at bedside. I  discussed goal of care yesterday with patient's daughter and decided to continue pain management and supportive care. Pain medication adjusted and added Ativan as needed for anxiety. Palliative care following. Patient will benefit from hospice care likely residential. I will consult social worker. I discussed about current medical conditions, poor prognosis in detail with the patient's family. I spent about 20 minutes only during discussion.  DVT prophylaxis: SCD Code Status: DNR/DNI Family Communication: Discussed with the patient's son and daughter at bedside Disposition Plan: Possibly discharge to residential hospice in 1-2 days    Consultants:   PCCM transfer  Palliative care  Procedures: None Antimicrobials: None currently  Subjective: Seen and examined at bedside. Patient was alert awake and complaining of severe abdominal pain.  Objective: Vitals:   01/20/17 0625 01/20/17 1243 01/20/17 2209 01/21/17 0423  BP:  (!) 162/77 (!) 145/74 132/78  Pulse: (!) 107 87 60 (!) 59  Resp:  14 18 18   Temp:   97.9 F (36.6 C) 97.8 F (36.6 C)  TempSrc:   Oral Oral  SpO2: 100% 98% 96% 96%  Weight:      Height:        Intake/Output Summary (Last 24 hours) at 01/21/17 1321 Last data filed at 01/21/17 1000  Gross per 24 hour  Intake                0 ml  Output                1 ml  Net               -1 ml   Filed Weights   01/18/17 0258 01/19/17 0500 01/20/17 0615  Weight: 56.4 kg (124 lb 5.4 oz) 57.5 kg (126  lb 12.2 oz) 56.1 kg (123 lb 10.9 oz)    Examination:  General exam:Ill-looking frail elderly female lying in bed with discomfort Respiratory system: Clear bilateral. Respiratory effort normal. No wheezing or crackle Cardiovascular system: Regular rate rhythm S1-S2 normal. Gastrointestinal system: Abdomen soft, diffuse tender. Bowel sound positive Central nervous system: Alert awake and following commands Extremities: Symmetric 5 x 5 power. Skin: No rashes, lesions or  ulcers    Data Reviewed: I have personally reviewed following labs and imaging studies  CBC:  Recent Labs Lab 01/25/2017 1705 01/29/2017 1744 01/16/17 0452  WBC 15.4*  --  23.0*  NEUTROABS 11.5*  --   --   HGB 7.8* 7.8* 7.8*  HCT 23.3* 23.0* 22.7*  MCV 73.5*  --  71.2*  PLT 430*  --  785*   Basic Metabolic Panel:  Recent Labs Lab 01/16/17 0047  01/17/17 0257 01/17/17 2105 01/18/17 0310 01/18/17 1503 01/19/17 0209  NA 132*  < > 139 134* 136 137 137  K 5.9*  < > 3.7 3.6 3.7 4.2 4.2  CL 106  < > 105 101 103 103 103  CO2 15*  < > 19* 16* 18* 16* 18*  GLUCOSE 498*  < > 102* 154* 176* 184* 150*  BUN 73*  < > 69* 73* 75* 79* 87*  CREATININE 3.81*  < > 4.18* 4.54* 4.77* 4.93* 5.07*  CALCIUM 8.0*  < > 7.9* 7.6* 7.9* 7.7* 7.5*  MG 2.1  --   --   --   --   --   --   PHOS 6.5*  --   --   --   --   --   --   < > = values in this interval not displayed. GFR: Estimated Creatinine Clearance: 7.5 mL/min (A) (by C-G formula based on SCr of 5.07 mg/dL (H)). Liver Function Tests:  Recent Labs Lab 01/11/2017 2145 01/16/17 0047  AST 123* 111*  ALT 48 50  ALKPHOS 138* 135*  BILITOT 0.5 1.1  PROT 6.2* 6.4*  ALBUMIN 2.3* 2.2*   No results for input(s): LIPASE, AMYLASE in the last 168 hours. No results for input(s): AMMONIA in the last 168 hours. Coagulation Profile:  Recent Labs Lab 01/28/2017 1705  INR 1.25   Cardiac Enzymes:  Recent Labs Lab 01/16/17 0047 01/16/17 0938  TROPONINI 0.09* 4.64*   BNP (last 3 results) No results for input(s): PROBNP in the last 8760 hours. HbA1C: No results for input(s): HGBA1C in the last 72 hours. CBG:  Recent Labs Lab 01/20/17 1655 01/20/17 2019 01/21/17 0023 01/21/17 0420 01/21/17 0948  GLUCAP 179* 204* 208* 194* 231*   Lipid Profile: No results for input(s): CHOL, HDL, LDLCALC, TRIG, CHOLHDL, LDLDIRECT in the last 72 hours. Thyroid Function Tests: No results for input(s): TSH, T4TOTAL, FREET4, T3FREE, THYROIDAB in the last  72 hours. Anemia Panel: No results for input(s): VITAMINB12, FOLATE, FERRITIN, TIBC, IRON, RETICCTPCT in the last 72 hours. Sepsis Labs:  Recent Labs Lab 01/18/2017 1721 01/07/2017 2151 01/16/17 0047 01/16/17 0155  LATICACIDVEN 7.50* 3.27* 2.2* 2.0*    Recent Results (from the past 240 hour(s))  Culture, blood (x 2)     Status: None (Preliminary result)   Collection Time: 01/16/17 12:35 AM  Result Value Ref Range Status   Specimen Description BLOOD RIGHT ANTECUBITAL  Final   Special Requests   Final    BOTTLES DRAWN AEROBIC AND ANAEROBIC Blood Culture adequate volume   Culture NO GROWTH 4 DAYS  Final  Report Status PENDING  Incomplete  Culture, blood (routine x 2)     Status: None (Preliminary result)   Collection Time: 01/16/17 12:47 AM  Result Value Ref Range Status   Specimen Description BLOOD RIGHT HAND  Final   Special Requests   Final    BOTTLES DRAWN AEROBIC AND ANAEROBIC Blood Culture adequate volume   Culture NO GROWTH 4 DAYS  Final   Report Status PENDING  Incomplete  MRSA PCR Screening     Status: None   Collection Time: 01/16/17  4:40 AM  Result Value Ref Range Status   MRSA by PCR NEGATIVE NEGATIVE Final    Comment:        The GeneXpert MRSA Assay (FDA approved for NASAL specimens only), is one component of a comprehensive MRSA colonization surveillance program. It is not intended to diagnose MRSA infection nor to guide or monitor treatment for MRSA infections.          Radiology Studies: No results found.      Scheduled Meds: . chlorhexidine gluconate (MEDLINE KIT)  15 mL Mouth Rinse BID  . dexamethasone  10 mg Intravenous Q6H  . mouth rinse  15 mL Mouth Rinse QID  . polyethylene glycol  17 g Per Tube Daily  . senna-docusate  1 tablet Per Tube BID   Continuous Infusions: . sodium chloride    . sodium chloride 10 mL/hr at 01/19/17 2000  . famotidine (PEPCID) IV Stopped (01/21/17 5041)     LOS: 6 days    Dron Tanna Furry,  MD [facility] Pager [phone]  If 7PM-7AM, please contact night-coverage [email or web address] Password [email or web address] 01/21/2017, 1:21 PM

## 2017-01-21 NOTE — Progress Notes (Signed)
[facility] notified that patient may benefit from IV Ativan. Patient appears to have anxiety. Arthor Captain LPN

## 2017-01-21 NOTE — Progress Notes (Signed)
Daily Progress Note   Patient Name: Anna Gray       Date: 01/21/2017 DOB: 03-May-1941  Age: 76 y.o. MRN#: [id number] Attending Physician: Rosita Fire, MD Primary Care Physician: Everrett Coombe, MD Admit Date: 01/16/2017  Reason for Consultation/Follow-up: Establishing goals of care, Hospice Evaluation, Inpatient hospice referral, Non pain symptom management, Pain control, Psychosocial/spiritual support and Terminal Care  Subjective: Met with patient, patient's son, Raquel Sarna, and daughter Philippa Sicks. Patient was unable to participate in assessment; she is minimally responsive. Patient unfortunately is nearing end-of-life and has metastatic cancer to her heart lungs brain bone and liver. Both her son and daughter are struggling with how rapidly in their mind, they have seen their mother change. 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 Her son Raquel Sarna, tells me that hospice has been presented to them on multiple occasions. They do however both seem to see that their mother is in a very different place with the extent of her cancer at this point .What they are specifically struggling with, is that she was alert post extubation on Thursday and they have seen her get progressively more somnolent. I did go into great length, pathophysiology related to her cancer and how decreased responsiveness would be expected. They are struggling with what is disease progression versus drug effect (Dilaudid, Ativan). We also talked about residential hospice at length.  Length of Stay: 6  Current Medications: Scheduled Meds:  . [START ON 01/22/2017] bisacodyl  10 mg Rectal QODAY  . chlorhexidine  gluconate (MEDLINE KIT)  15 mL Mouth Rinse BID  . dexamethasone  10 mg Intravenous Q8H  . mouth rinse  15 mL Mouth Rinse QID    Continuous Infusions: . sodium chloride    . sodium chloride 10 mL/hr at 01/19/17 2000  . famotidine (PEPCID) IV Stopped (01/21/17 0907)    PRN Meds: sodium chloride, acetaminophen, HYDROmorphone (DILAUDID) injection, labetalol, LORazepam, ondansetron (ZOFRAN) IV  Physical Exam  Constitutional:  Frail, acutely ill-appearing female in no acute distress; she is minimally responsive  Cardiovascular: Normal rate.   Pulmonary/Chest: Effort normal.  Neurological:  Minimally responsive  Skin: Skin is warm and dry.  Psychiatric:  Unable to test  Nursing note and vitals reviewed.  Vital Signs: BP 132/78 (BP Location: Left Arm)   Pulse (!) 59   Temp 97.8 F (36.6 C) (Oral)   Resp 18   Ht 5' 2"  (1.575 m)   Wt 56.1 kg (123 lb 10.9 oz)   SpO2 96%   BMI 22.62 kg/m  SpO2: SpO2: 96 % O2 Device: O2 Device: Nasal Cannula O2 Flow Rate: O2 Flow Rate (L/min): 4 L/min  Intake/output summary:  Intake/Output Summary (Last 24 hours) at 01/21/17 1423 Last data filed at 01/21/17 1000  Gross per 24 hour  Intake                0 ml  Output                1 ml  Net               -1 ml   LBM: Last BM Date: 01/20/17 Baseline Weight: Weight: 49.8 kg (109 lb 12.6 oz) Most recent weight: Weight: 56.1 kg (123 lb 10.9 oz)       Palliative Assessment/Data:    Flowsheet Rows     Most Recent Value  Intake Tab  Referral Department  Hospitalist  Unit at Time of Referral  Cardiac/Telemetry Unit  Palliative Care Primary Diagnosis  Cancer  Date Notified  01/20/17  Palliative Care Type  New Palliative care  Reason for referral  Clarify Goals of Care  Date of Admission  01/04/2017  Date first seen by Palliative Care  01/20/17  # of days Palliative referral response time  0 Day(s)  # of days IP prior to Palliative referral  5  Clinical Assessment  Palliative  Performance Scale Score  20%  Psychosocial & Spiritual Assessment  Palliative Care Outcomes  Patient/Family meeting held?  Yes  Who was at the meeting?  patient, sister, daughter, son  Palliative Care Outcomes  Clarified goals of care      Patient Active Problem List   Diagnosis Date Noted  . Goals of care, counseling/discussion   . Pain management   . Palliative care encounter   . Respiratory distress   . Brain metastases ([place])   . Encounter for orogastric (OG) tube placement   . Acute respiratory failure with hypoxia ([place]) 02/01/2017  . Benzodiazepine dependence ([place]) 04/22/2016  . Essential tremor 03/23/2016  . Chronic systolic congestive heart failure ([place]) 03/02/2016  . CKD (chronic kidney disease) 03/02/2016  . Malignant neoplasm metastatic to lung ([place]) 03/02/2016  . Malignant neoplasm of liver ([place]) 03/02/2016  . Type 2 diabetes mellitus without complication, with long-term current use of insulin ([place]) 03/02/2016  . Essential hypertension 03/02/2016    Palliative Care Assessment & Plan   Patient Profile: 76 y.o. female  with past medical history of liver cancer, DM, CKD, CHF s/p pacemaker, chronic anxiety who was admitted on 02/01/2017 with hypoxic respiratory failure and shock. She was intubated and admitted.  Work up revealed metastatic cancer of uncertain origin to the brain, lung, retroperitoneum, and heart (RA and RV).  Her CA-125 is elevated.  She has since been extubated and weaned to nasal canula.  PMT was consulted for goals of care and possible hospice evaluation  Patient has become progressively more somnolent since extubation on Thursday. She is now minimally responsive. Family is struggling between disease progression, end-of-life, versus sedation secondary to medications  Assessment: Shared candidly with family that I felt what they were seen was more disease progression than medication side effects. I did however support their feelings of "just  Thursday she  was talking to [place], and now she is minimally responsive". Patient also apparently has been incredibly resilient 4 years regarding cancer. I also shared that I did not feel like patient would benefit from rehabilitation at this point. We discussed residential hospice, specifically what they could offer and what they could not offer ((IV fluids and IV antibiotics blood transfusion). In my opinion, they are leaning towards residential hospice but just need 24-48 hours to fully reach that place  Recommendations/Plan:  After speaking with family, we decided to leave Dilaudid and Ativan on an as-needed basis, but with more frequency  as well as offering a range of dosing. The goal of this would be to help family process patient's decline vs med effect. I also introduce the concept that if she is requiring 3 or more breakthrough doses in a 24-hour period I would recommend starting a drip  or starting scheduled Dilaudid and Ativan  We will decrease Decadron to 10 mg every 8 hours. Increase Decadron could be contributing to her anxiety and agitation  No further laboratory draws at this point although this is something her daughter is struggling with  Family is questioning resuming insulin and checking her blood sugars because of her sugars ranging in the 200s. Patient is not eating. I explained to family that elevated blood sugars are in response to steroids, that her new normal blood sugar would be in the 200 range and that this would be safer than risking hypoglycemia  Palliative provider to meet with family at 0930 01/22/17  Goals of Care and Additional Recommendations:  Limitations on Scope of Treatment: Minimize Medications, Initiate Comfort Feeding, No Artificial Feeding, No Blood Transfusions, No Chemotherapy, No Diagnostics, No Hemodialysis, No Lab Draws, No Radiation, No Surgical Procedures and No Tracheostomy  Code Status:    Code Status Orders        Start     Ordered   01/16/17 0025  Do  not attempt resuscitation (DNR)  Continuous    Question Answer Comment  In the event of cardiac or respiratory ARREST Do not call a "code blue"   In the event of cardiac or respiratory ARREST Do not perform Intubation, CPR, defibrillation or ACLS   In the event of cardiac or respiratory ARREST Use medication by any route, position, wound care, and other measures to relive pain and suffering. May use oxygen, suction and manual treatment of airway obstruction as needed for comfort.      01/16/17 0024    Code Status History    Date Active Date Inactive Code Status Order ID Comments User Context   01/21/2017 10:58 PM 01/16/2017 12:24 AM Full Code [id number]  Reyne Dumas, MD ED   03/11/2016  8:58 PM 03/14/2016 10:33 PM Full Code [id number]  Ritta Slot, NP Inpatient   03/09/2016 10:17 AM 03/11/2016  8:58 PM DNR [id number]  Oswald Hillock, MD Inpatient       Prognosis:   Hours - Days in the setting of widely metastatic cancer to both lungs retroperitoneum, liver, heart, brain and skull  Discharge Planning:  Pt at high risk for a hospital death. Have presented residential hospice and hopefully family will be in a place to move forward with this in the next 24 hours, but they have not firmly agreed to this at the time of this writing. I do not think that SNF with rehab is an option given severity of disease  Care plan was discussed with Carolin Sicks  Thank you  for allowing the Palliative Medicine Team to assist in the care of this patient.   Time In: 1300 Time Out: 1345 Total Time 45 min Prolonged Time Billed  no       Greater than 50%  of this time was spent counseling and coordinating care related to the above assessment and plan.  Dory Horn, NP  Please contact Palliative Medicine Team phone at [phone] for questions and concerns.

## 2017-01-22 DIAGNOSIS — Z515 Encounter for palliative care: Secondary | ICD-10-CM

## 2017-01-22 MED ORDER — HYDROMORPHONE HCL 1 MG/ML IJ SOLN
0.2500 mg | INTRAMUSCULAR | Status: DC
  Administered 2017-01-22 (×4): 0.25 mg via INTRAVENOUS
  Filled 2017-01-22 (×4): qty 1

## 2017-01-22 NOTE — Progress Notes (Signed)
Daily Progress Note   Patient Name: Anna Gray       Date: 01/22/2017 DOB: [birth year]  Age: 76 y.o. MRN#: [id number] Attending Physician: Rosita Fire, MD Primary Care Physician: Everrett Coombe, MD Admit Date: 01/26/2017  Reason for Consultation/Follow-up: Establishing goals of care, Inpatient hospice referral, Non pain symptom management, Pain control and Psychosocial/spiritual support  Subjective: Patient remains minimally responsive except when pain or other distress awakens her. Per chart review she has had 3 mg of Dilaudid in the previous 24 hours as well as 3 mg of Ativan in the previous 24 hours. Daughter continues to struggle with metastatic versus disease progression. Patient is no longer eating or drinking. She is unable to take oral medications. Reviewed again clinical status with daughter and her brother   Length of Stay: 7  Current Medications: Scheduled Meds:  . bisacodyl  10 mg Rectal QODAY  . chlorhexidine gluconate (MEDLINE KIT)  15 mL Mouth Rinse BID  . dexamethasone  10 mg Intravenous Q8H  .  HYDROmorphone (DILAUDID) injection  0.25 mg Intravenous Q4H  . mouth rinse  15 mL Mouth Rinse QID    Continuous Infusions: . sodium chloride    . sodium chloride 10 mL/hr at 01/19/17 2000  . famotidine (PEPCID) IV Stopped (01/22/17 1020)    PRN Meds: sodium chloride, acetaminophen, HYDROmorphone (DILAUDID) injection, labetalol, LORazepam, ondansetron (ZOFRAN) IV  Physical Exam  Constitutional:  Acutely ill appearing older female; minimally responsive  Cardiovascular: Normal rate.   Pulmonary/Chest:  Respiration shallow  Neurological:  Minimally responsive  Skin: Skin is warm and dry.  Psychiatric:  Unable to test  Nursing note and vitals reviewed.             Vital Signs: BP (!) 114/59 (BP Location: Right Leg)   Pulse (!) 59   Temp 98 F (36.7 C) (Oral)   Resp 20   Ht 5' 2" (1.575 m)   Wt 54 kg (119 lb 1.6 oz)   SpO2 97%   BMI 21.78 kg/m  SpO2: SpO2: 97 % O2 Device: O2 Device: Nasal Cannula O2 Flow Rate: O2 Flow Rate (L/min): 4 L/min  Intake/output summary:  Intake/Output Summary (Last 24 hours) at 01/22/17 1229 Last data filed at 01/22/17 0636  Gross per 24 hour  Intake  586 ml  Output              500 ml  Net               86 ml   LBM: Last BM Date: 01/20/17 Baseline Weight: Weight: 49.8 kg (109 lb 12.6 oz) Most recent weight: Weight: 54 kg (119 lb 1.6 oz)       Palliative Assessment/Data:    Flowsheet Rows     Most Recent Value  Intake Tab  Referral Department  Hospitalist  Unit at Time of Referral  Cardiac/Telemetry Unit  Palliative Care Primary Diagnosis  Cancer  Date Notified  01/20/17  Palliative Care Type  New Palliative care  Reason for referral  Clarify Goals of Care  Date of Admission  01/05/2017  Date first seen by Palliative Care  01/20/17  # of days Palliative referral response time  0 Day(s)  # of days IP prior to Palliative referral  5  Clinical Assessment  Palliative Performance Scale Score  20%  Psychosocial & Spiritual Assessment  Palliative Care Outcomes  Patient/Family meeting held?  Yes  Who was at the meeting?  patient, sister, daughter, son  Palliative Care Outcomes  Clarified goals of care      Patient Active Problem List   Diagnosis Date Noted  . Goals of care, counseling/discussion   . Pain management   . Palliative care encounter   . Respiratory distress   . Brain metastases ([place])   . Encounter for orogastric (OG) tube placement   . Acute respiratory failure with hypoxia ([place]) 01/21/2017  . Benzodiazepine dependence ([place]) 04/22/2016  . Essential tremor 03/23/2016  . Chronic systolic congestive heart failure ([place]) 03/02/2016  . CKD (chronic kidney disease)  03/02/2016  . Malignant neoplasm metastatic to lung ([place]) 03/02/2016  . Malignant neoplasm of liver ([place]) 03/02/2016  . Type 2 diabetes mellitus without complication, with long-term current use of insulin ([place]) 03/02/2016  . Essential hypertension 03/02/2016    Palliative Care Assessment & Plan   Patient Profile: 76 y.o.femalewith past medical history of liver cancer, DM, CKD, CHF s/p pacemaker, chronic anxietywho was admitted on 8/12/2018with hypoxic respiratory failure and shock. She was intubated and admitted. Work up revealed metastatic cancer of uncertain origin to the brain, lung, retroperitoneum, and heart (RA and RV). Her CA-125 is elevated. She has since been extubated and weaned to nasal canula. PMT was consulted for goals of care and possible hospice evaluation  Patient has become progressively more somnolent since extubation on Thursday. She is now minimally responsive. Family is struggling between disease progression, end-of-life, versus sedation secondary to medications   Recommendations/Plan:  Pain: Daughter is agreeable to scheduled Dilaudid at 0.25 mg every 4 hours and continued 0.5 mg to 1 mg every 2 hours as needed for breakthrough pain or shortness of breath  Anxiety: Continue with Ativan on as needed basis. Monitor for need for scheduled dosing  Family getting closer to making the decision to move to residential hospice . They agreed to go tour [facility] this morning. I did call [facility] and gave verbal update as to family coming for a tour as well as clinical information regarding patient. It is my opinion that she would meet inpatient criteria  I did share with her daughter and son that I believed that if things were to continue to go the way they are now that unfortunately, her mother would likely die in a matter of days  to a week  Goals of Care and Additional Recommendations:  Limitations on Scope of Treatment: No  Artificial Feeding, No Blood Transfusions, No Chemotherapy, No Hemodialysis, No Radiation, No Surgical Procedures and No Tracheostomy  Code Status:    Code Status Orders        Start     Ordered   01/16/17 0025  Do not attempt resuscitation (DNR)  Continuous    Question Answer Comment  In the event of cardiac or respiratory ARREST Do not call a "code blue"   In the event of cardiac or respiratory ARREST Do not perform Intubation, CPR, defibrillation or ACLS   In the event of cardiac or respiratory ARREST Use medication by any route, position, wound care, and other measures to relive pain and suffering. May use oxygen, suction and manual treatment of airway obstruction as needed for comfort.      01/16/17 0024    Code Status History    Date Active Date Inactive Code Status Order ID Comments User Context   01/10/2017 10:58 PM 01/16/2017 12:24 AM Full Code [id number]  Reyne Dumas, MD ED   03/11/2016  8:58 PM 03/14/2016 10:33 PM Full Code [id number]  Ritta Slot, NP Inpatient   03/09/2016 10:17 AM 03/11/2016  8:58 PM DNR [id number]  Oswald Hillock, MD Inpatient       Prognosis:   Hours - Days in the setting of advanced, widely metastatic cancer; patient has had hepatocellular carcinoma for 10 years and now has metastatic disease to retroperitoneum, both lungs, right atrium, right ventricle, brain as well as erosion into the skull. She is no longer eating and drinking and is unresponsive except to noxious stimuli  Discharge Planning:  To Be Determined  Care plan was discussed with Dr. Carolin Sicks  Thank you for allowing the Palliative Medicine Team to assist in the care of this patient.   Time In: 0900 Time Out: 0940 Total Time 40 min Prolonged Time Billed  no       Greater than 50%  of this time was spent counseling and coordinating care related to the above assessment and plan.  Dory Horn, NP  Please contact Palliative Medicine Team phone at [phone] for questions  and concerns.

## 2017-01-22 NOTE — Progress Notes (Signed)
PROGRESS NOTE    Anna Gray  DXA:[id number] DOB: 1940/12/14 DOA: 01/18/2017 PCP: Everrett Coombe, MD   Brief Narrative: [phone] with CHF, CKD, Depression, DM, HCC, Anxiety (on chronic benzos), presented to the ER via EMS for SOB and Weakness. En-route to the ER she became unresponsive and hypotensive. Patient intubated on arrival to the ER. After intubation it was discovered that patient was DNR/DNI. She was found to be in shock (BP 63/47) and started on vasopressors peripherally.Found with met cancer diffuse, brian , lung , heart. Patient was extubated, made comfort measures and transferred to the floor by PCCM.  Palliative care consulted.   Assessment & Plan:  # Acute respiratory failure with hypoxia due to right pleural effusion, atelectasis and pulmonary metastasis: required intubation: Patient is DO NOT INTUBATE, extubated. Currently on nasal cannula oxygen.   # Likely cardiogenic shock: Blood pressure improved. Off pressors. Lasix discontinued by PCCM  #Chronic kidney disease status 5 with hyperkalemia on admission: Not a candidate for hemodialysis.   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 CT chest with multiple pulmonary metastases. Continue Decadron. Large Mass in right atrium and right ventricle that appears to be invading the atrium and ventricular septum findings suggestive of diastolic cardiac lesions  # Pain management: Patient looked comfortable today. Started on scheduled IV Dilaudid. Discussed with the palliative care service.  #Severe protein calorie malnutrition: Dietary and swallow evaluation. On dysphagia diet  # Goals of care discussion: Patient is DNR/DNI. I had a long discussion with patient's son and daughter yesterday and the day before. Also discussed with the palliative care service. Patient is DNR/DNI. Social worker  consult for residential hospice. Patient with poor prognosis. DVT prophylaxis: SCD Code Status: DNR/DNI Family Communication: Discussed with the patient's son and daughter at bedside yesterday. Disposition Plan: Possibly discharge to residential hospice in 1-2 days    Consultants:   PCCM transfer  Palliative care  Procedures: None Antimicrobials: None currently  Subjective: Seen and examined at bedside. Patient was somnolent but responsive with name. Looks comfortable. Objective: Vitals:   01/21/17 1424 01/21/17 2106 01/21/17 2106 01/22/17 0615  BP: (!) 143/71 (!) 113/59 (!) 113/59 (!) 114/59  Pulse: (!) 59 (!) 59 (!) 59 (!) 59  Resp: 16 18 18 20   Temp: 97.7 F (36.5 C) 97.8 F (36.6 C) (!) 97.4 F (36.3 C) 98 F (36.7 C)  TempSrc: Axillary Oral Oral Oral  SpO2: 93% 97% 97% 97%  Weight:    54 kg (119 lb 1.6 oz)  Height:        Intake/Output Summary (Last 24 hours) at 01/22/17 1159 Last data filed at 01/22/17 0636  Gross per 24 hour  Intake              586 ml  Output              500 ml  Net               86 ml   Filed Weights   01/19/17 0500 01/20/17 0615 01/22/17 0615  Weight: 57.5 kg (126 lb 12.2 oz) 56.1 kg (123 lb 10.9 oz) 54 kg (119 lb 1.6 oz)    Examination:  General exam:Ill-looking frail elderly female Lying on bed, somnolent but alert with the name. Looks comfortable Respiratory system: Clear bilateral, respiratory effort normal Cardiovascular system: Regular rate rhythm S1-S2 normal. Gastrointestinal system: Abdomen soft, diffuse  tender. Bowel sound positive Central nervous system: Somnolent Skin: No rashes, lesions or ulcers    Data Reviewed: I have personally reviewed following labs and imaging studies  CBC:  Recent Labs Lab 01/17/2017 1705 01/20/2017 1744 01/16/17 0452  WBC 15.4*  --  23.0*  NEUTROABS 11.5*  --   --   HGB 7.8* 7.8* 7.8*  HCT 23.3* 23.0* 22.7*  MCV 73.5*  --  71.2*  PLT 430*  --  786*   Basic Metabolic  Panel:  Recent Labs Lab 01/16/17 0047  01/17/17 0257 01/17/17 2105 01/18/17 0310 01/18/17 1503 01/19/17 0209  NA 132*  < > 139 134* 136 137 137  K 5.9*  < > 3.7 3.6 3.7 4.2 4.2  CL 106  < > 105 101 103 103 103  CO2 15*  < > 19* 16* 18* 16* 18*  GLUCOSE 498*  < > 102* 154* 176* 184* 150*  BUN 73*  < > 69* 73* 75* 79* 87*  CREATININE 3.81*  < > 4.18* 4.54* 4.77* 4.93* 5.07*  CALCIUM 8.0*  < > 7.9* 7.6* 7.9* 7.7* 7.5*  MG 2.1  --   --   --   --   --   --   PHOS 6.5*  --   --   --   --   --   --   < > = values in this interval not displayed. GFR: Estimated Creatinine Clearance: 7.5 mL/min (A) (by C-G formula based on SCr of 5.07 mg/dL (H)). Liver Function Tests:  Recent Labs Lab 01/12/2017 2145 01/16/17 0047  AST 123* 111*  ALT 48 50  ALKPHOS 138* 135*  BILITOT 0.5 1.1  PROT 6.2* 6.4*  ALBUMIN 2.3* 2.2*   No results for input(s): LIPASE, AMYLASE in the last 168 hours. No results for input(s): AMMONIA in the last 168 hours. Coagulation Profile:  Recent Labs Lab 01/14/2017 1705  INR 1.25   Cardiac Enzymes:  Recent Labs Lab 01/16/17 0047 01/16/17 0938  TROPONINI 0.09* 4.64*   BNP (last 3 results) No results for input(s): PROBNP in the last 8760 hours. HbA1C: No results for input(s): HGBA1C in the last 72 hours. CBG:  Recent Labs Lab 01/21/17 0023 01/21/17 0420 01/21/17 0948 01/21/17 1630 01/21/17 2104  GLUCAP 208* 194* 231* 234* 225*   Lipid Profile: No results for input(s): CHOL, HDL, LDLCALC, TRIG, CHOLHDL, LDLDIRECT in the last 72 hours. Thyroid Function Tests: No results for input(s): TSH, T4TOTAL, FREET4, T3FREE, THYROIDAB in the last 72 hours. Anemia Panel: No results for input(s): VITAMINB12, FOLATE, FERRITIN, TIBC, IRON, RETICCTPCT in the last 72 hours. Sepsis Labs:  Recent Labs Lab 01/26/2017 1721 02/01/2017 2151 01/16/17 0047 01/16/17 0155  LATICACIDVEN 7.50* 3.27* 2.2* 2.0*    Recent Results (from the past 240 hour(s))  Culture, blood  (x 2)     Status: None   Collection Time: 01/16/17 12:35 AM  Result Value Ref Range Status   Specimen Description BLOOD RIGHT ANTECUBITAL  Final   Special Requests   Final    BOTTLES DRAWN AEROBIC AND ANAEROBIC Blood Culture adequate volume   Culture NO GROWTH 5 DAYS  Final   Report Status 01/21/2017 FINAL  Final  Culture, blood (routine x 2)     Status: None   Collection Time: 01/16/17 12:47 AM  Result Value Ref Range Status   Specimen Description BLOOD RIGHT HAND  Final   Special Requests   Final    BOTTLES DRAWN AEROBIC AND ANAEROBIC Blood Culture adequate volume  Culture NO GROWTH 5 DAYS  Final   Report Status 01/21/2017 FINAL  Final  MRSA PCR Screening     Status: None   Collection Time: 01/16/17  4:40 AM  Result Value Ref Range Status   MRSA by PCR NEGATIVE NEGATIVE Final    Comment:        The GeneXpert MRSA Assay (FDA approved for NASAL specimens only), is one component of a comprehensive MRSA colonization surveillance program. It is not intended to diagnose MRSA infection nor to guide or monitor treatment for MRSA infections.          Radiology Studies: No results found.      Scheduled Meds: . bisacodyl  10 mg Rectal QODAY  . chlorhexidine gluconate (MEDLINE KIT)  15 mL Mouth Rinse BID  . dexamethasone  10 mg Intravenous Q8H  .  HYDROmorphone (DILAUDID) injection  0.25 mg Intravenous Q4H  . mouth rinse  15 mL Mouth Rinse QID   Continuous Infusions: . sodium chloride    . sodium chloride 10 mL/hr at 01/19/17 2000  . famotidine (PEPCID) IV Stopped (01/22/17 1020)     LOS: 7 days    Dron Tanna Furry, MD [facility] Pager [phone]  If 7PM-7AM, please contact night-coverage [email or web address] Password [email or web address] 01/22/2017, 11:59 AM

## 2017-02-04 NOTE — Progress Notes (Signed)
When rounding on my patient, I saw her taking agonal breaths and placed a call to the son to see if he would like to come to the hospital.  Son had returned to [place] and called the sister who lives locally.  While waiting for family the patient passed away.  Nikki Faucette and Wilson Singer RN's called the time of death.  Family arrived, body prepared and waiting on the funeral home.

## 2017-02-04 NOTE — Progress Notes (Signed)
Placed call to [facility] and spoke to [facility], reference # [id number].  Patient is not a candidate for donation.

## 2017-02-04 NOTE — Death Summary Note (Signed)
Death Summary  Anna Gray HYI:[id number] DOB: [birth year] DOA: 2017/01/17  PCP: Everrett Coombe, MD  Admit date: 17-Jan-2017 Date of Death: 01-25-17 Time of Death: 00:52 Notification: Everrett Coombe, MD notified of death of Jan 25, 2017   History of present illness:  76yoF with CHF, CKD, Depression, DM, HCC, Anxiety (on chronic benzos), presented to the ER via EMS for SOB and Weakness. En-route to the ER she became unresponsive and hypotensive. Patient intubated on arrival to the ER. After intubation it was discovered that patient was DNR/DNI. She was found to be in shock (BP 63/47) and started on vasopressors peripherally.Found with met cancer diffuse, brian , lung , heart. Patient was extubated, made comfort measures and transferred to the floor by PCCM.   # Acute respiratory failure with hypoxia due to right pleural effusion, atelectasis and pulmonary metastasis: required intubation: Patient is DO NOT INTUBATE, extubated.  # Likely cardiogenic shock #Chronic kidney disease status 5 with hyperkalemia on admission: #Hepatocellular carcinoma Ovarian cyst versus mass likely malignancy Diffuse metastasis to the brain and lungs: CT scan of head showed multiple brain metastases with vasogenic edema. CT chest with multiple pulmonary metastases. Continue Decadron. Large Mass in right atrium and right ventricle that appears to be invading the atrium and ventricular septum findings suggestive of diastolic cardiac lesions Severe protein calorie malnutrition.  Patient was DNR/DNI, comfort measures and evaluation ongoing for residential hospice care. Patient was evaluated by palliative care as well. Patient passed away on Jan 25, 2017, 00:52. I was not present at the time of death.  The results of significant diagnostics from this hospitalization (including imaging, microbiology, ancillary and laboratory) are listed below for reference.    Significant Diagnostic Studies: Ct Abdomen Pelvis Wo  Contrast  Result Date: 01/17/2017 CLINICAL DATA:  Acute respiratory illness. Hepatocellular carcinoma. Chronic kidney disease. Lung metastases. Recently transferred from [place]. EXAM: CT CHEST, ABDOMEN AND PELVIS WITHOUT CONTRAST TECHNIQUE: Multidetector CT imaging of the chest, abdomen and pelvis was performed following the standard protocol without IV contrast. COMPARISON:  Portable chest obtained earlier today. FINDINGS: CT CHEST FINDINGS Cardiovascular: Enlarged heart. Left subclavian pacemaker leads. Atheromatous arterial calcifications, including the aorta and coronary arteries. No pericardial fluid. Mediastinum/Nodes: Orogastric tube extending into the stomach. Endotracheal tube in satisfactory position. Multiple enlarged mediastinal nodes. These include a precarinal node with a short axis diameter of 23 mm on image number 25 of series 7. Lungs/Pleura: Multiple bilateral lung nodules. The largest is in the left upper lobe, measuring 1.8 cm in maximum diameter on image number 14 of series 8. Moderate-sized right pleural effusion. Small left pleural effusion. The pleural fluid on the right includes loculated fluid superiorly in the medial aspect of the hemithorax. There is also right upper lobe and right lower lobe atelectasis. Mild left lower lobe atelectasis. Musculoskeletal: Thoracic and lower cervical spine degenerative changes. No evidence of bony metastatic disease. CT ABDOMEN PELVIS FINDINGS Hepatobiliary: Large area of mildly decreased density in the posterior segment of the right lobe of the liver, measuring approximately 7.2 x 5.7 cm on image number 67 of series 7. The remainder of the liver is mildly heterogeneous. No well-defined liver masses are visualized. The gallbladder is not visualized. There is some air in the biliary tree. Pancreas: Not well visualized, grossly unremarkable with with the exception of air in the pancreatic duct. Spleen: Small and oval in configuration. Adrenals/Urinary  Tract: Poorly defined adrenal glands, grossly unremarkable. Small kidneys. Unremarkable urinary bladder. No visible urinary tract calculi or hydronephrosis. Stomach/Bowel: Orogastric tube tip in  the distal stomach. The stomach is dilated. Unremarkable small bowel, colon and appendix. Vascular/Lymphatic: Atheromatous arterial calcifications without aneurysm. Mildly enlarged retrocrural lymph nodes. These include a 10 mm short axis node on image number 56 of series 7. Mildly enlarged retroperitoneal lymph nodes, including a left para-aortic node with a short axis diameter of 8 mm on image number 62 of series 7. Reproductive: Calcified uterine fibroids. Large left ovarian cyst, measuring 8.2 cm in maximum diameter. No visible soft tissue components. Other: Diffuse subcutaneous edema. Musculoskeletal: Mild lumbar spine degenerative changes. No evidence of bony metastatic disease. IMPRESSION: 1. Multiple lung metastases. 2. Moderate-sized right pleural effusion. This includes loculated pleural fluid superiorly, possibly due to pleural metastatic disease. 3. Small left pleural effusion. 4. Bilateral atelectasis, greater on the right. 5. Metastatic mediastinal adenopathy. 6. Approximately 7.2 x 5.7 cm ill-defined area of low density in the posterior segment of the right lobe of the liver. This could represent the patient's known hepatocellular carcinoma, metastasis or geographical area of steatosis. 7. Minimal retrocrural and retroperitoneal adenopathy. This could be metastatic or reactive. 8. 8.2 cm left ovarian cyst. At this age, this is concerning for a possible ovarian neoplasm. Electronically Signed   By: Claudie Revering M.D.   On: 01/26/2017 19:19   Dg Chest 1 View  Result Date: 01/22/2017 CLINICAL DATA:  Initial evaluation for intubation. EXAM: CHEST 1 VIEW COMPARISON:  Prior radiograph from 03/12/2016. FINDINGS: Endotracheal tube in place with tip positioned 3.7 cm above the carina. Enteric tube courses in the the  abdomen. Left-sided pacemaker/ AICD. Defibrillator pad overlies left chest. Stable cardiomegaly. Mediastinal silhouette within normal limits. Aortic atherosclerosis. Large left pleural effusion. Diffuse vascular congestion with interstitial prominence, right greater than left, suggesting pulmonary edema. No definite focal infiltrates. No appreciable pneumothorax on this supine projection. No acute osseus abnormality. IMPRESSION: 1. Tip of the endotracheal tube 3.7 cm above the carina. 2. Cardiomegaly with pulmonary interstitial edema and large right pleural effusion. Electronically Signed   By: Jeannine Boga M.D.   On: 01/22/2017 17:45   Ct Head Wo Contrast  Result Date: 01/19/2017 CLINICAL DATA:  Altered mental status.  Hepatocellular carcinoma. EXAM: CT HEAD WITHOUT CONTRAST TECHNIQUE: Contiguous axial images were obtained from the base of the skull through the vertex without intravenous contrast. COMPARISON:  None. FINDINGS: Brain: There is a high density mass in the posterior right frontal lobe extending through eroded bone at the skull vertex into the scalp soft tissues. In the coronal plane, this mass extends across midline and measures 4.3 x 2.7 cm on image number 34. This extends into the posterior right frontal lobe with adjacent white matter low density. No significant mass effect. Also demonstrated is an oval high density mass in the left cerebellar hemisphere, measuring 2.2 x 1.5 cm on image 5 of series 3 without adjacent bone involvement. Also demonstrated is a similar-appearing mass in the right cerebellar hemisphere, measuring 0.9 x 0.8 cm on image number 4 of series 3. This also does not involve nearby bone. Diffusely enlarged ventricles and subarachnoid spaces. Mild patchy white matter low density in both cerebral hemispheres. No intracranial hemorrhage. Vascular: The mass in the superior aspect of the posterior right frontal lobe and extending through the skull vertex is most likely  invading and obstructing the superior sagittal sinus. Skull: Mass eroding through the skull vertex, as described above. Sinuses/Orbits: No acute finding. Other: Endotracheal and orogastric tubes in place. IMPRESSION: 1. Large metastasis in the superior aspect of the posterior right frontal  lobe extending through the skull vertex, across midline and into the overlying scalp soft tissues. There is associated vasogenic edema in the adjacent right frontal lobe white matter. This would be amenable to percutaneous biopsy. 2. Additional metastases in the right and left cerebellar hemispheres. 3. No intracranial hemorrhage or mass effect. 4. Mild to moderate diffuse cerebral and cerebellar atrophy and mild chronic small vessel white matter ischemic changes in both cerebral hemispheres. Electronically Signed   By: Claudie Revering M.D.   On: 01/20/2017 19:02   Ct Chest Wo Contrast  Result Date: 01/31/2017 CLINICAL DATA:  Acute respiratory illness. Hepatocellular carcinoma. Chronic kidney disease. Lung metastases. Recently transferred from [place]. EXAM: CT CHEST, ABDOMEN AND PELVIS WITHOUT CONTRAST TECHNIQUE: Multidetector CT imaging of the chest, abdomen and pelvis was performed following the standard protocol without IV contrast. COMPARISON:  Portable chest obtained earlier today. FINDINGS: CT CHEST FINDINGS Cardiovascular: Enlarged heart. Left subclavian pacemaker leads. Atheromatous arterial calcifications, including the aorta and coronary arteries. No pericardial fluid. Mediastinum/Nodes: Orogastric tube extending into the stomach. Endotracheal tube in satisfactory position. Multiple enlarged mediastinal nodes. These include a precarinal node with a short axis diameter of 23 mm on image number 25 of series 7. Lungs/Pleura: Multiple bilateral lung nodules. The largest is in the left upper lobe, measuring 1.8 cm in maximum diameter on image number 14 of series 8. Moderate-sized right pleural effusion. Small left pleural  effusion. The pleural fluid on the right includes loculated fluid superiorly in the medial aspect of the hemithorax. There is also right upper lobe and right lower lobe atelectasis. Mild left lower lobe atelectasis. Musculoskeletal: Thoracic and lower cervical spine degenerative changes. No evidence of bony metastatic disease. CT ABDOMEN PELVIS FINDINGS Hepatobiliary: Large area of mildly decreased density in the posterior segment of the right lobe of the liver, measuring approximately 7.2 x 5.7 cm on image number 67 of series 7. The remainder of the liver is mildly heterogeneous. No well-defined liver masses are visualized. The gallbladder is not visualized. There is some air in the biliary tree. Pancreas: Not well visualized, grossly unremarkable with with the exception of air in the pancreatic duct. Spleen: Small and oval in configuration. Adrenals/Urinary Tract: Poorly defined adrenal glands, grossly unremarkable. Small kidneys. Unremarkable urinary bladder. No visible urinary tract calculi or hydronephrosis. Stomach/Bowel: Orogastric tube tip in the distal stomach. The stomach is dilated. Unremarkable small bowel, colon and appendix. Vascular/Lymphatic: Atheromatous arterial calcifications without aneurysm. Mildly enlarged retrocrural lymph nodes. These include a 10 mm short axis node on image number 56 of series 7. Mildly enlarged retroperitoneal lymph nodes, including a left para-aortic node with a short axis diameter of 8 mm on image number 62 of series 7. Reproductive: Calcified uterine fibroids. Large left ovarian cyst, measuring 8.2 cm in maximum diameter. No visible soft tissue components. Other: Diffuse subcutaneous edema. Musculoskeletal: Mild lumbar spine degenerative changes. No evidence of bony metastatic disease. IMPRESSION: 1. Multiple lung metastases. 2. Moderate-sized right pleural effusion. This includes loculated pleural fluid superiorly, possibly due to pleural metastatic disease. 3. Small  left pleural effusion. 4. Bilateral atelectasis, greater on the right. 5. Metastatic mediastinal adenopathy. 6. Approximately 7.2 x 5.7 cm ill-defined area of low density in the posterior segment of the right lobe of the liver. This could represent the patient's known hepatocellular carcinoma, metastasis or geographical area of steatosis. 7. Minimal retrocrural and retroperitoneal adenopathy. This could be metastatic or reactive. 8. 8.2 cm left ovarian cyst. At this age, this is concerning for a  possible ovarian neoplasm. Electronically Signed   By: Claudie Revering M.D.   On: 01/09/2017 19:19   Dg Chest Port 1 View  Result Date: 01/16/2017 CLINICAL DATA:  Acute onset of respiratory failure. Initial encounter. EXAM: PORTABLE CHEST 1 VIEW COMPARISON:  Chest radiograph performed 01/07/2017 FINDINGS: The patient's endotracheal tube is seen ending 4-5 cm above the carina. An enteric tube is noted extending below the diaphragm. A relatively large right-sided pleural effusion is again noted, perhaps slightly increased from the prior study. Underlying vascular congestion is noted. Increased interstitial markings raise concern for pulmonary edema or possibly pneumonia. No pneumothorax is seen. The cardiomediastinal silhouette is mildly enlarged. A pacemaker is noted overlying the left chest wall, with leads ending overlying the right atrium and right ventricle. External pacing pads are noted. No acute osseous abnormalities are identified. IMPRESSION: 1. Endotracheal tube seen ending 4-5 cm above the carina. 2. Relatively large right-sided pleural effusion again noted, perhaps slightly increased from the prior study. Underlying vascular congestion and mild cardiomegaly. Increased interstitial markings raise concern for pulmonary edema or possibly pneumonia. Electronically Signed   By: Garald Balding M.D.   On: 01/16/2017 04:46   Dg Chest Portable 1 View  Result Date: 01/27/2017 CLINICAL DATA:  ETT placement EXAM:  PORTABLE CHEST 1 VIEW COMPARISON:  01/27/2017 FINDINGS: Endotracheal tube tip is about 2.5 cm superior to the carina. Left-sided pacing device similar compared to prior. Moderate right pleural effusion or thickening has slightly increased. Diffuse hazy opacity in the right thorax likely due to layering effusion and underlying atelectasis or infiltrate. Cardiomegaly with atherosclerosis. IMPRESSION: 1. Endotracheal tube tip about 2.5 cm superior to carina 2. Cardiomegaly. 3. Increased large right-sided pleural effusion with diffuse hazy opacity in the right thorax which may be due to combination of layering fluid and underlying parenchymal disease. Electronically Signed   By: Donavan Foil M.D.   On: 01/06/2017 23:48   Dg Abd Portable 1v  Result Date: 01/16/2017 CLINICAL DATA:  76 y/o  F; orogastric tube placement. EXAM: PORTABLE ABDOMEN - 1 VIEW COMPARISON:  01/08/2017 CT of the abdomen and pelvis. FINDINGS: Severe gastric distention. Enteric tube tip tip projects over the distal stomach. No acute osseous abnormality identified. Aortic and iliofemoral calcific atherosclerosis. IMPRESSION: Severe gastric distention. Enteric tube tip projects over distal stomach. Electronically Signed   By: Kristine Garbe M.D.   On: 01/16/2017 05:32    Microbiology: Recent Results (from the past 240 hour(s))  Culture, blood (x 2)     Status: None   Collection Time: 01/16/17 12:35 AM  Result Value Ref Range Status   Specimen Description BLOOD RIGHT ANTECUBITAL  Final   Special Requests   Final    BOTTLES DRAWN AEROBIC AND ANAEROBIC Blood Culture adequate volume   Culture NO GROWTH 5 DAYS  Final   Report Status 01/21/2017 FINAL  Final  Culture, blood (routine x 2)     Status: None   Collection Time: 01/16/17 12:47 AM  Result Value Ref Range Status   Specimen Description BLOOD RIGHT HAND  Final   Special Requests   Final    BOTTLES DRAWN AEROBIC AND ANAEROBIC Blood Culture adequate volume   Culture NO  GROWTH 5 DAYS  Final   Report Status 01/21/2017 FINAL  Final  MRSA PCR Screening     Status: None   Collection Time: 01/16/17  4:40 AM  Result Value Ref Range Status   MRSA by PCR NEGATIVE NEGATIVE Final    Comment:  The GeneXpert MRSA Assay (FDA approved for NASAL specimens only), is one component of a comprehensive MRSA colonization surveillance program. It is not intended to diagnose MRSA infection nor to guide or monitor treatment for MRSA infections.      Labs: Basic Metabolic Panel:  Recent Labs Lab 01/17/17 0257 01/17/17 2105 01/18/17 0310 01/18/17 1503 01/19/17 0209  NA 139 134* 136 137 137  K 3.7 3.6 3.7 4.2 4.2  CL 105 101 103 103 103  CO2 19* 16* 18* 16* 18*  GLUCOSE 102* 154* 176* 184* 150*  BUN 69* 73* 75* 79* 87*  CREATININE 4.18* 4.54* 4.77* 4.93* 5.07*  CALCIUM 7.9* 7.6* 7.9* 7.7* 7.5*   Liver Function Tests: No results for input(s): AST, ALT, ALKPHOS, BILITOT, PROT, ALBUMIN in the last 168 hours. No results for input(s): LIPASE, AMYLASE in the last 168 hours. No results for input(s): AMMONIA in the last 168 hours. CBC: No results for input(s): WBC, NEUTROABS, HGB, HCT, MCV, PLT in the last 168 hours. Cardiac Enzymes: No results for input(s): CKTOTAL, CKMB, CKMBINDEX, TROPONINI in the last 168 hours. D-Dimer No results for input(s): DDIMER in the last 72 hours. BNP: Invalid input(s): POCBNP CBG:  Recent Labs Lab 01/21/17 0023 01/21/17 0420 01/21/17 0948 01/21/17 1630 01/21/17 2104  GLUCAP 208* 194* 231* 234* 225*   Anemia work up No results for input(s): VITAMINB12, FOLATE, FERRITIN, TIBC, IRON, RETICCTPCT in the last 72 hours. Urinalysis    Component Value Date/Time   COLORURINE YELLOW 01/16/2017 0026   APPEARANCEUR CLOUDY (A) 01/16/2017 0026   LABSPEC 1.014 01/16/2017 0026   PHURINE 5.0 01/16/2017 0026   GLUCOSEU NEGATIVE 01/16/2017 0026   HGBUR NEGATIVE 01/16/2017 0026   BILIRUBINUR NEGATIVE 01/16/2017 0026   KETONESUR  NEGATIVE 01/16/2017 0026   PROTEINUR 100 (A) 01/16/2017 0026   NITRITE NEGATIVE 01/16/2017 0026   LEUKOCYTESUR NEGATIVE 01/16/2017 0026   Sepsis Labs Invalid input(s): PROCALCITONIN,  WBC,  LACTICIDVEN     SIGNED:  Rosita Fire, MD  [facility] Jan 29, 2017, 11:42 AM Pager   If 7PM-7AM, please contact night-coverage [email or web address] Password [email or web address]

## 2017-02-04 DEATH — deceased

## 2019-04-06 IMAGING — DX DG CHEST 1V
1 series · 1 of 1 positions shown · non-contrast
Comparison: Prior radiograph from 03/12/2016.

CLINICAL DATA: Initial evaluation for intubation.

EXAM:
CHEST 1 VIEW

[chest ap]
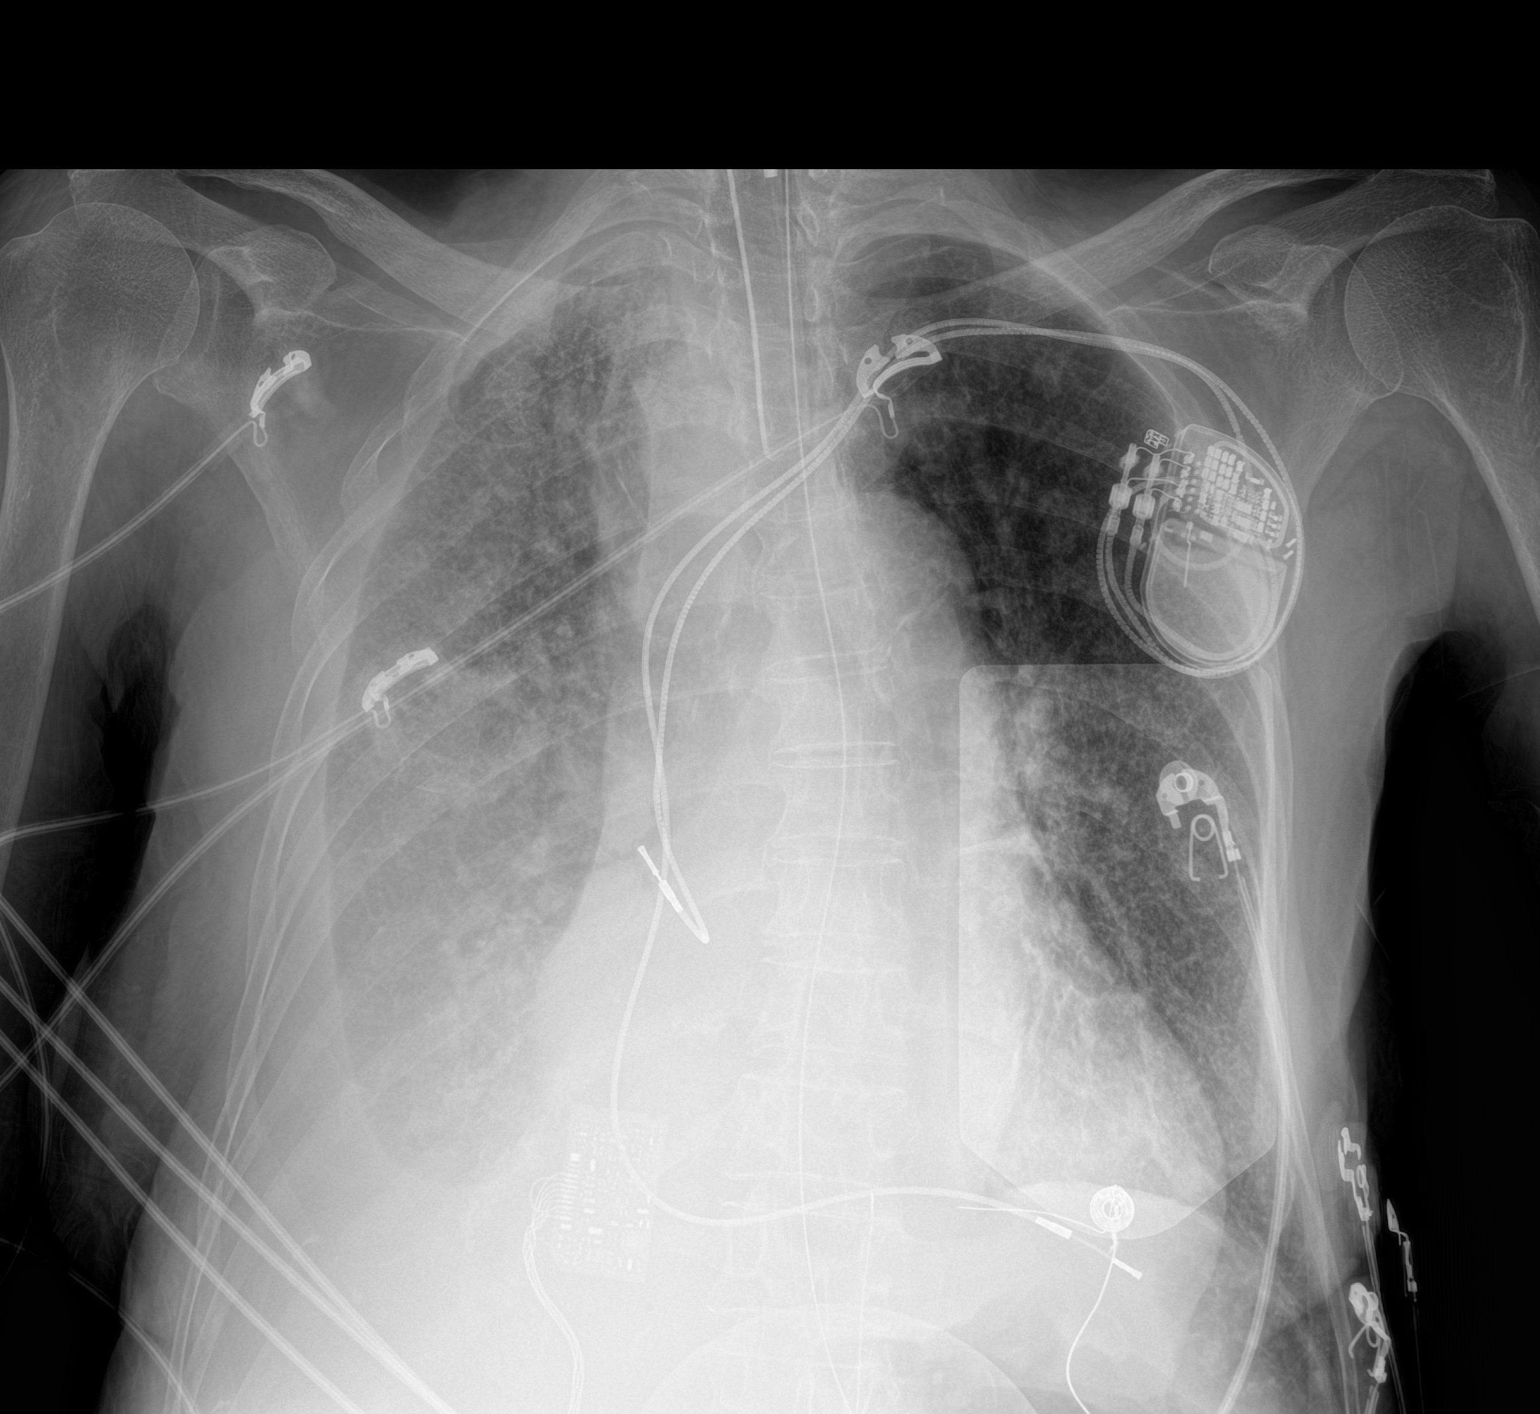

[1 of 1 positions shown; findings below may reference images not displayed]

FINDINGS: Endotracheal tube in place with tip positioned 3.7 cm above the
carina. Enteric tube courses in the the abdomen. Left-sided
pacemaker/ AICD. Defibrillator pad overlies left chest. Stable
cardiomegaly. Mediastinal silhouette within normal limits. Aortic
atherosclerosis.

Large left pleural effusion. Diffuse vascular congestion with
interstitial prominence, right greater than left, suggesting
pulmonary edema. No definite focal infiltrates. No appreciable
pneumothorax on this supine projection.

No acute osseus abnormality.
IMPRESSION: 1. Tip of the endotracheal tube 3.7 cm above the carina.
2. Cardiomegaly with pulmonary interstitial edema and large right
pleural effusion.

## 2019-04-06 IMAGING — CT CT ABD-PELV W/O CM
2 of 4 series · 11 of 47 positions shown, 13 images · IV contrast (Omni 300)
Comparison: Portable chest obtained earlier today.

CLINICAL DATA: Acute respiratory illness. Hepatocellular carcinoma.
Chronic kidney disease. Lung metastases. Recently transferred from
[place].

EXAM:
CT CHEST, ABDOMEN AND PELVIS WITHOUT CONTRAST
TECHNIQUE: Multidetector CT imaging of the chest, abdomen and pelvis was
performed following the standard protocol without IV contrast.

[Series 7: cap with · axial · 0.67mm/px · z∈[-764,-270]mm · 8 of 125 slices shown, 10 images]
[im 13/125  brain]
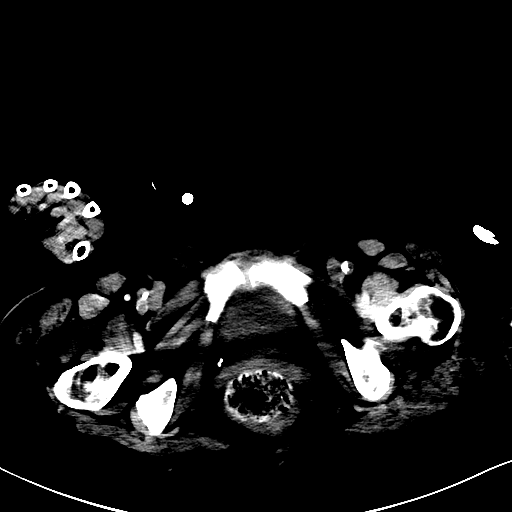
[im 13/125  bone]
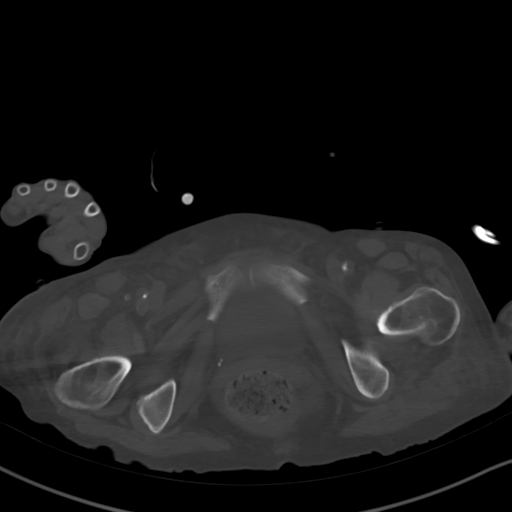
[im 25/125  brain]
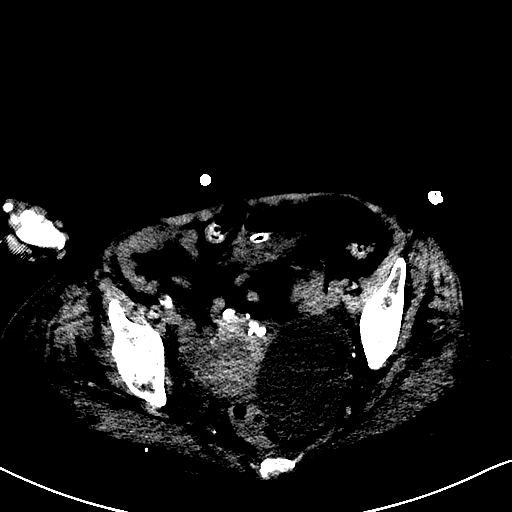
[im 38/125  brain]
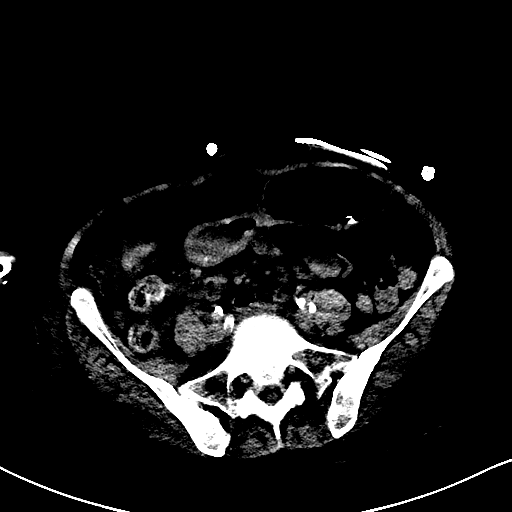
[im 56/125  brain]
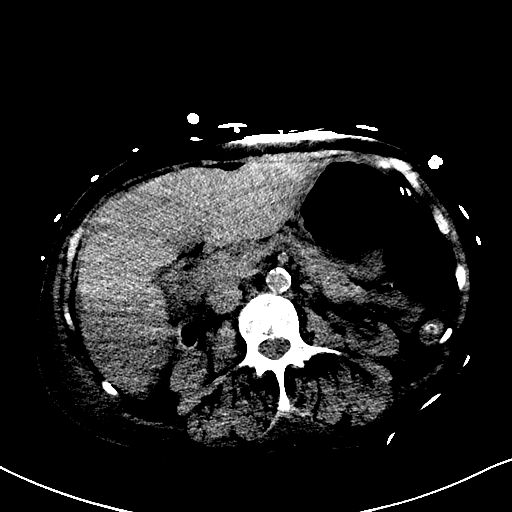
[im 69/125  brain]
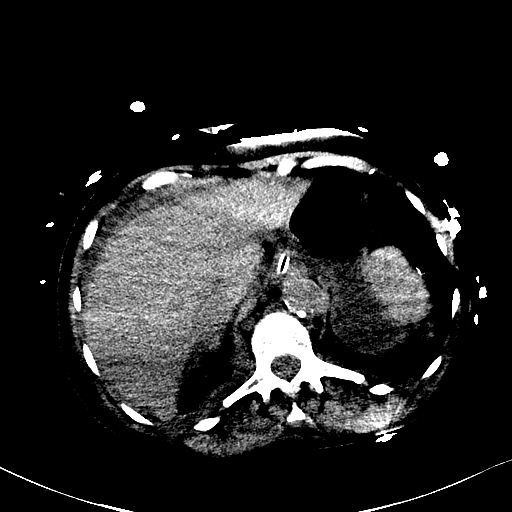
[im 69/125  bone]
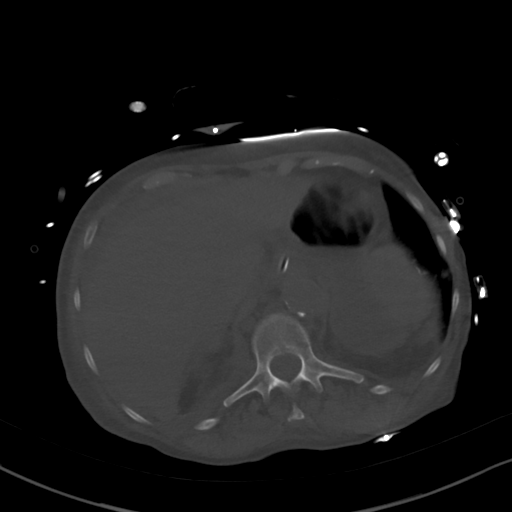
[im 87/125  brain]
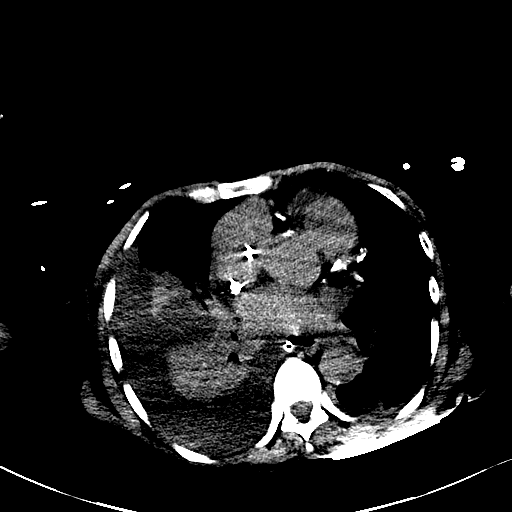
[im 100/125  brain]
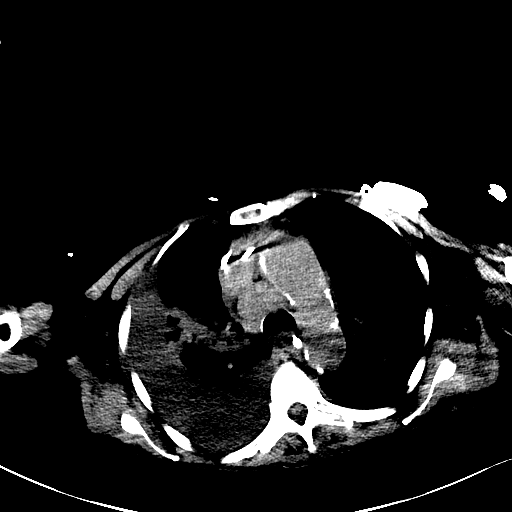
[im 112/125  brain]
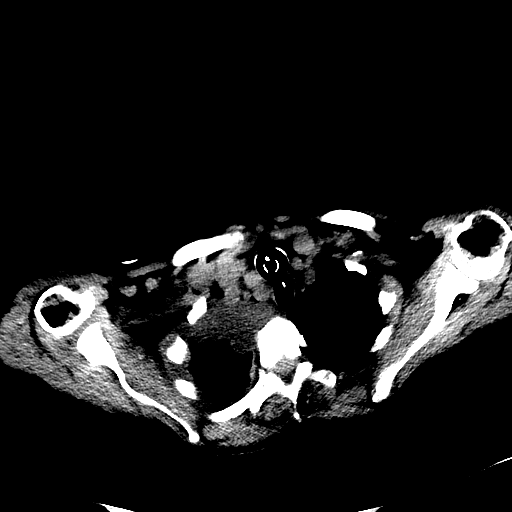

[Series 9: cap with 3.0 mm st cor · coronal · 0.61mm/px · 3 of 112 slices shown]
[im 38/112  brain]
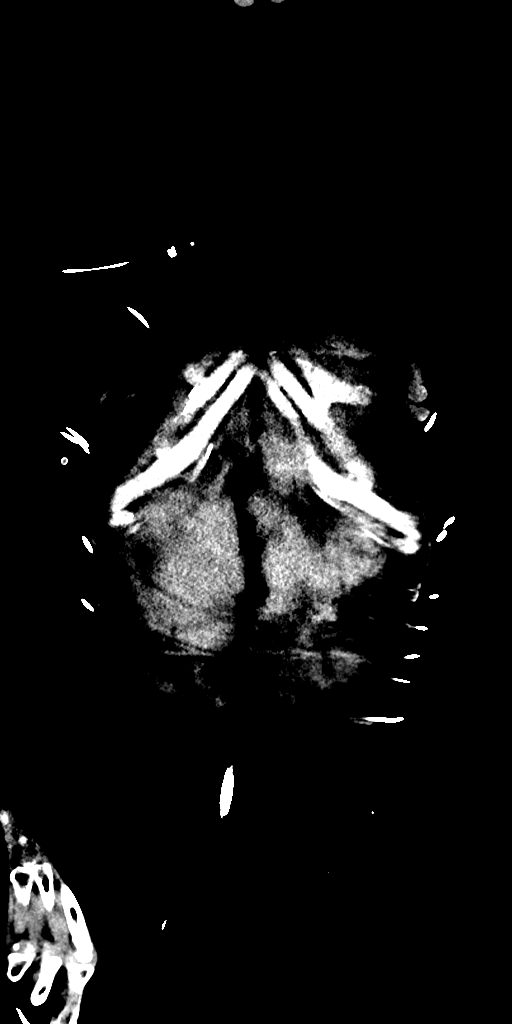
[im 50/112  brain]
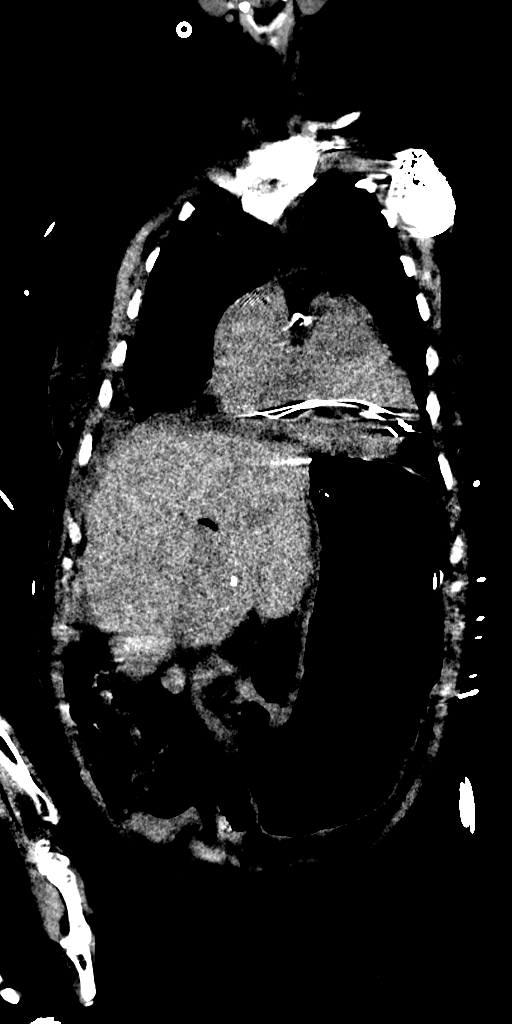
[im 62/112  brain]
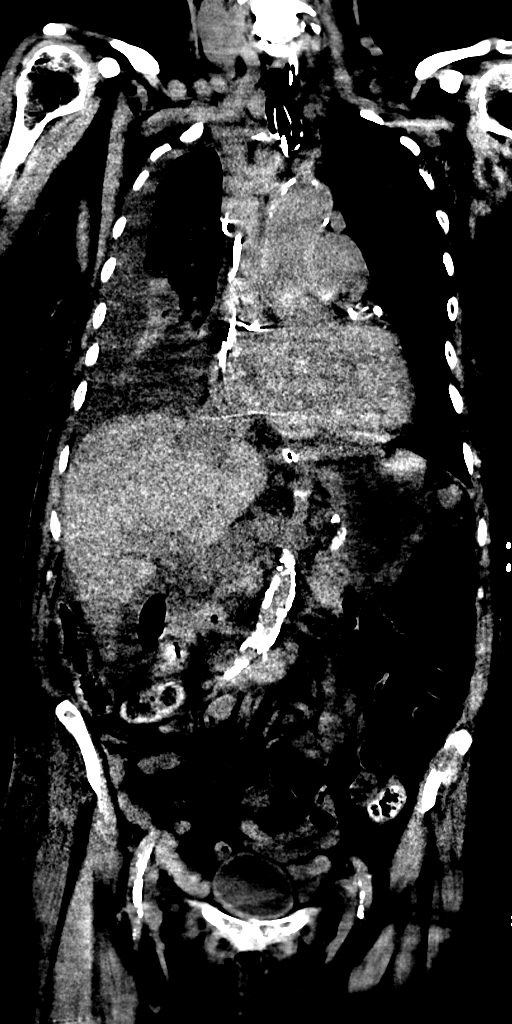

[11 of 47 positions shown; findings below may reference images not displayed]

FINDINGS: CT CHEST FINDINGS

Cardiovascular: Enlarged heart. Left subclavian pacemaker leads.
Atheromatous arterial calcifications, including the aorta and
coronary arteries. No pericardial fluid.

Mediastinum/Nodes: Orogastric tube extending into the stomach.
Endotracheal tube in satisfactory position. Multiple enlarged
mediastinal nodes. These include a precarinal node with a short axis
diameter of 23 mm on image number 25 of series 7.

Lungs/Pleura: Multiple bilateral lung nodules. The largest is in the
left upper lobe, measuring 1.8 cm in maximum diameter on image
number 14 of series 8. Moderate-sized right pleural effusion. Small
left pleural effusion. The pleural fluid on the right includes
loculated fluid superiorly in the medial aspect of the hemithorax.
There is also right upper lobe and right lower lobe atelectasis.
Mild left lower lobe atelectasis.

Musculoskeletal: Thoracic and lower cervical spine degenerative
changes. No evidence of bony metastatic disease.

CT ABDOMEN PELVIS FINDINGS

Hepatobiliary: Large area of mildly decreased density in the
posterior segment of the right lobe of the liver, measuring
approximately 7.2 x 5.7 cm on image number 67 of series 7. The
remainder of the liver is mildly heterogeneous. No well-defined
liver masses are visualized. The gallbladder is not visualized.
There is some air in the biliary tree.

Pancreas: Not well visualized, grossly unremarkable with with the
exception of air in the pancreatic duct.

Spleen: Small and oval in configuration.

Adrenals/Urinary Tract: Poorly defined adrenal glands, grossly
unremarkable. Small kidneys. Unremarkable urinary bladder. No
visible urinary tract calculi or hydronephrosis.

Stomach/Bowel: Orogastric tube tip in the distal stomach. The
stomach is dilated. Unremarkable small bowel, colon and appendix.

Vascular/Lymphatic: Atheromatous arterial calcifications without
aneurysm. Mildly enlarged retrocrural lymph nodes. These include a
10 mm short axis node on image number 56 of series 7. Mildly
enlarged retroperitoneal lymph nodes, including a left para-aortic
node with a short axis diameter of 8 mm on image number 62 of series
7.

Reproductive: Calcified uterine fibroids. Large left ovarian cyst,
measuring 8.2 cm in maximum diameter. No visible soft tissue
components.

Other: Diffuse subcutaneous edema.

Musculoskeletal: Mild lumbar spine degenerative changes. No evidence
of bony metastatic disease.
IMPRESSION: 1. Multiple lung metastases.
2. Moderate-sized right pleural effusion. This includes loculated
pleural fluid superiorly, possibly due to pleural metastatic
disease.
3. Small left pleural effusion.
4. Bilateral atelectasis, greater on the right.
5. Metastatic mediastinal adenopathy.
6. Approximately 7.2 x 5.7 cm ill-defined area of low density in the
posterior segment of the right lobe of the liver. This could
represent the patient's known hepatocellular carcinoma, metastasis
or geographical area of steatosis.
7. Minimal retrocrural and retroperitoneal adenopathy. This could be
metastatic or reactive.
8. 8.2 cm left ovarian cyst. At this age, this is concerning for a
possible ovarian neoplasm.
